# Patient Record
Sex: Male | Born: 1965 | Race: White | Hispanic: No | Marital: Married | Smoking: Never smoker
Health system: Southern US, Community
[De-identification: ages and names within clinical notes are randomized; demographics above are authoritative.]

## PROBLEM LIST (undated history)

## (undated) DIAGNOSIS — E785 Hyperlipidemia, unspecified: Secondary | ICD-10-CM

## (undated) DIAGNOSIS — N200 Calculus of kidney: Secondary | ICD-10-CM

## (undated) HISTORY — PX: COLONOSCOPY: SHX174

## (undated) HISTORY — PX: OTHER SURGICAL HISTORY: SHX169

---

## 2018-12-22 ENCOUNTER — Encounter (HOSPITAL_COMMUNITY): Payer: Self-pay | Admitting: Emergency Medicine

## 2018-12-22 ENCOUNTER — Observation Stay (HOSPITAL_COMMUNITY)
Admission: EM | Admit: 2018-12-22 | Discharge: 2018-12-24 | Disposition: A | Discharge status: Home / Self Care | Payer: BLUE CROSS/BLUE SHIELD | Attending: Internal Medicine | Admitting: Internal Medicine

## 2018-12-22 ENCOUNTER — Other Ambulatory Visit: Payer: Self-pay

## 2018-12-22 DIAGNOSIS — E785 Hyperlipidemia, unspecified: Secondary | ICD-10-CM | POA: Diagnosis not present

## 2018-12-22 DIAGNOSIS — Z86010 Personal history of colonic polyps: Secondary | ICD-10-CM | POA: Diagnosis not present

## 2018-12-22 DIAGNOSIS — K633 Ulcer of intestine: Secondary | ICD-10-CM | POA: Insufficient documentation

## 2018-12-22 DIAGNOSIS — D62 Acute posthemorrhagic anemia: Secondary | ICD-10-CM | POA: Diagnosis not present

## 2018-12-22 DIAGNOSIS — Z79899 Other long term (current) drug therapy: Secondary | ICD-10-CM | POA: Diagnosis not present

## 2018-12-22 DIAGNOSIS — K641 Second degree hemorrhoids: Secondary | ICD-10-CM | POA: Insufficient documentation

## 2018-12-22 DIAGNOSIS — Z1159 Encounter for screening for other viral diseases: Secondary | ICD-10-CM | POA: Diagnosis not present

## 2018-12-22 DIAGNOSIS — K5730 Diverticulosis of large intestine without perforation or abscess without bleeding: Secondary | ICD-10-CM | POA: Diagnosis not present

## 2018-12-22 DIAGNOSIS — K91840 Postprocedural hemorrhage and hematoma of a digestive system organ or structure following a digestive system procedure: Secondary | ICD-10-CM | POA: Diagnosis not present

## 2018-12-22 DIAGNOSIS — Y838 Other surgical procedures as the cause of abnormal reaction of the patient, or of later complication, without mention of misadventure at the time of the procedure: Secondary | ICD-10-CM | POA: Diagnosis not present

## 2018-12-22 DIAGNOSIS — K922 Gastrointestinal hemorrhage, unspecified: Secondary | ICD-10-CM

## 2018-12-22 DIAGNOSIS — K625 Hemorrhage of anus and rectum: Secondary | ICD-10-CM | POA: Diagnosis present

## 2018-12-22 DIAGNOSIS — K644 Residual hemorrhoidal skin tags: Secondary | ICD-10-CM | POA: Diagnosis not present

## 2018-12-22 DIAGNOSIS — R109 Unspecified abdominal pain: Secondary | ICD-10-CM

## 2018-12-22 DIAGNOSIS — K631 Perforation of intestine (nontraumatic): Secondary | ICD-10-CM

## 2018-12-22 HISTORY — DX: Calculus of kidney: N20.0

## 2018-12-22 HISTORY — DX: Hyperlipidemia, unspecified: E78.5

## 2018-12-22 LAB — CBC
HCT: 45.4 % (ref 39.0–52.0)
Hemoglobin: 15.5 g/dL (ref 13.0–17.0)
MCH: 31.4 pg (ref 26.0–34.0)
MCHC: 34.1 g/dL (ref 30.0–36.0)
MCV: 91.9 fL (ref 80.0–100.0)
Platelets: 289 10*3/uL (ref 150–400)
RBC: 4.94 MIL/uL (ref 4.22–5.81)
RDW: 11.9 % (ref 11.5–15.5)
WBC: 10.2 10*3/uL (ref 4.0–10.5)
nRBC: 0 % (ref 0.0–0.2)

## 2018-12-22 NOTE — ED Triage Notes (Signed)
Per pt he had a colonoscopy yesterday where they removed two polyps, today he is having a rectal bleed all day long with BM.

## 2018-12-23 ENCOUNTER — Other Ambulatory Visit: Payer: Self-pay

## 2018-12-23 ENCOUNTER — Observation Stay (HOSPITAL_COMMUNITY): Payer: BLUE CROSS/BLUE SHIELD

## 2018-12-23 ENCOUNTER — Observation Stay (HOSPITAL_COMMUNITY): Payer: BLUE CROSS/BLUE SHIELD | Admitting: Certified Registered"

## 2018-12-23 ENCOUNTER — Encounter (HOSPITAL_COMMUNITY): Payer: Self-pay | Admitting: Internal Medicine

## 2018-12-23 ENCOUNTER — Encounter (HOSPITAL_COMMUNITY)
Admission: EM | Disposition: A | Discharge status: Home / Self Care | Payer: Self-pay | Source: Home / Self Care | Attending: Emergency Medicine

## 2018-12-23 DIAGNOSIS — K633 Ulcer of intestine: Secondary | ICD-10-CM | POA: Diagnosis not present

## 2018-12-23 DIAGNOSIS — E785 Hyperlipidemia, unspecified: Secondary | ICD-10-CM | POA: Diagnosis not present

## 2018-12-23 DIAGNOSIS — K625 Hemorrhage of anus and rectum: Secondary | ICD-10-CM | POA: Diagnosis not present

## 2018-12-23 DIAGNOSIS — K5790 Diverticulosis of intestine, part unspecified, without perforation or abscess without bleeding: Secondary | ICD-10-CM | POA: Diagnosis not present

## 2018-12-23 DIAGNOSIS — K91840 Postprocedural hemorrhage and hematoma of a digestive system organ or structure following a digestive system procedure: Secondary | ICD-10-CM

## 2018-12-23 DIAGNOSIS — K921 Melena: Secondary | ICD-10-CM

## 2018-12-23 DIAGNOSIS — K922 Gastrointestinal hemorrhage, unspecified: Secondary | ICD-10-CM | POA: Diagnosis present

## 2018-12-23 DIAGNOSIS — K641 Second degree hemorrhoids: Secondary | ICD-10-CM

## 2018-12-23 DIAGNOSIS — K5730 Diverticulosis of large intestine without perforation or abscess without bleeding: Secondary | ICD-10-CM

## 2018-12-23 HISTORY — PX: COLONOSCOPY WITH PROPOFOL: SHX5780

## 2018-12-23 HISTORY — PX: SCLEROTHERAPY: SHX6841

## 2018-12-23 HISTORY — PX: HEMOSTASIS CLIP PLACEMENT: SHX6857

## 2018-12-23 LAB — CBC
HCT: 44 % (ref 39.0–52.0)
HCT: 39.6 % (ref 39.0–52.0)
HCT: 35.4 % — ABNORMAL LOW (ref 39.0–52.0)
HCT: 30.7 % — ABNORMAL LOW (ref 39.0–52.0)
Hemoglobin: 15.2 g/dL (ref 13.0–17.0)
Hemoglobin: 13.2 g/dL (ref 13.0–17.0)
Hemoglobin: 12 g/dL — ABNORMAL LOW (ref 13.0–17.0)
Hemoglobin: 10.5 g/dL — ABNORMAL LOW (ref 13.0–17.0)
MCH: 31.7 pg (ref 26.0–34.0)
MCH: 30.7 pg (ref 26.0–34.0)
MCH: 31.7 pg (ref 26.0–34.0)
MCH: 31.3 pg (ref 26.0–34.0)
MCHC: 34.5 g/dL (ref 30.0–36.0)
MCHC: 33.3 g/dL (ref 30.0–36.0)
MCHC: 33.9 g/dL (ref 30.0–36.0)
MCHC: 34.2 g/dL (ref 30.0–36.0)
MCV: 91.9 fL (ref 80.0–100.0)
MCV: 92.1 fL (ref 80.0–100.0)
MCV: 93.4 fL (ref 80.0–100.0)
MCV: 91.6 fL (ref 80.0–100.0)
Platelets: 283 10*3/uL (ref 150–400)
Platelets: 259 10*3/uL (ref 150–400)
Platelets: 245 10*3/uL (ref 150–400)
Platelets: 229 10*3/uL (ref 150–400)
RBC: 4.79 MIL/uL (ref 4.22–5.81)
RBC: 4.3 MIL/uL (ref 4.22–5.81)
RBC: 3.79 MIL/uL — ABNORMAL LOW (ref 4.22–5.81)
RBC: 3.35 MIL/uL — ABNORMAL LOW (ref 4.22–5.81)
RDW: 11.9 % (ref 11.5–15.5)
RDW: 12 % (ref 11.5–15.5)
RDW: 12 % (ref 11.5–15.5)
RDW: 12 % (ref 11.5–15.5)
WBC: 10.9 10*3/uL — ABNORMAL HIGH (ref 4.0–10.5)
WBC: 9.3 10*3/uL (ref 4.0–10.5)
WBC: 18.1 10*3/uL — ABNORMAL HIGH (ref 4.0–10.5)
WBC: 19.8 10*3/uL — ABNORMAL HIGH (ref 4.0–10.5)
nRBC: 0 % (ref 0.0–0.2)
nRBC: 0 % (ref 0.0–0.2)
nRBC: 0 % (ref 0.0–0.2)
nRBC: 0 % (ref 0.0–0.2)

## 2018-12-23 LAB — PROTIME-INR
INR: 1.1 (ref 0.8–1.2)
Prothrombin Time: 13.7 seconds (ref 11.4–15.2)

## 2018-12-23 LAB — COMPREHENSIVE METABOLIC PANEL
ALT: 40 U/L (ref 0–44)
AST: 26 U/L (ref 15–41)
Albumin: 3.8 g/dL (ref 3.5–5.0)
Alkaline Phosphatase: 79 U/L (ref 38–126)
Anion gap: 10 (ref 5–15)
BUN: 19 mg/dL (ref 6–20)
CO2: 21 mmol/L — ABNORMAL LOW (ref 22–32)
Calcium: 9.4 mg/dL (ref 8.9–10.3)
Chloride: 104 mmol/L (ref 98–111)
Creatinine, Ser: 1.14 mg/dL (ref 0.61–1.24)
GFR calc Af Amer: >60 mL/min (ref >60)
GFR calc non Af Amer: >60 mL/min (ref >60)
Glucose, Bld: 124 mg/dL — ABNORMAL HIGH (ref 70–99)
Potassium: 4.1 mmol/L (ref 3.5–5.1)
Sodium: 135 mmol/L (ref 135–145)
Total Bilirubin: 0.6 mg/dL (ref 0.3–1.2)
Total Protein: 6.7 g/dL (ref 6.5–8.1)

## 2018-12-23 LAB — TYPE AND SCREEN
ABO/RH(D): O POS
Antibody Screen: NEGATIVE

## 2018-12-23 LAB — ABO/RH: ABO/RH(D): O POS

## 2018-12-23 LAB — BASIC METABOLIC PANEL
Anion gap: 7 (ref 5–15)
BUN: 16 mg/dL (ref 6–20)
CO2: 26 mmol/L (ref 22–32)
Calcium: 8.3 mg/dL — ABNORMAL LOW (ref 8.9–10.3)
Chloride: 102 mmol/L (ref 98–111)
Creatinine, Ser: 1.12 mg/dL (ref 0.61–1.24)
GFR calc Af Amer: >60 mL/min (ref >60)
GFR calc non Af Amer: >60 mL/min (ref >60)
Glucose, Bld: 119 mg/dL — ABNORMAL HIGH (ref 70–99)
Potassium: 3.5 mmol/L (ref 3.5–5.1)
Sodium: 135 mmol/L (ref 135–145)

## 2018-12-23 LAB — GLUCOSE, CAPILLARY: Glucose-Capillary: 93 mg/dL (ref 70–99)

## 2018-12-23 LAB — APTT: aPTT: 30 seconds (ref 24–36)

## 2018-12-23 LAB — HIV ANTIBODY (ROUTINE TESTING W REFLEX): HIV Screen 4th Generation wRfx: NONREACTIVE

## 2018-12-23 LAB — POC OCCULT BLOOD, ED: Fecal Occult Bld: POSITIVE — AB

## 2018-12-23 LAB — SARS CORONAVIRUS 2 BY RT PCR (HOSPITAL ORDER, PERFORMED IN ~~LOC~~ HOSPITAL LAB): SARS Coronavirus 2: NEGATIVE

## 2018-12-23 SURGERY — COLONOSCOPY WITH PROPOFOL
Anesthesia: Monitor Anesthesia Care

## 2018-12-23 MED ORDER — FENTANYL CITRATE (PF) 250 MCG/5ML IJ SOLN
50.0000 ug | Freq: Once | INTRAMUSCULAR | Status: AC
  Administered 2018-12-23: 50 ug via INTRAVENOUS

## 2018-12-23 MED ORDER — SODIUM CHLORIDE (PF) 0.9 % IJ SOLN: PREFILLED_SYRINGE | INTRAMUSCULAR | Status: DC | PRN

## 2018-12-23 MED ORDER — CIPROFLOXACIN IN D5W 400 MG/200ML IV SOLN
400.0000 mg | Freq: Two times a day (BID) | INTRAVENOUS | Status: DC
  Administered 2018-12-23 – 2018-12-24 (×3): 400 mg via INTRAVENOUS
  Filled 2018-12-23 (×2): qty 200

## 2018-12-23 MED ORDER — SODIUM CHLORIDE 0.9 % IV SOLN
INTRAVENOUS | Status: DC
  Administered 2018-12-23 – 2018-12-24 (×2): via INTRAVENOUS

## 2018-12-23 MED ORDER — ONDANSETRON HCL 4 MG PO TABS: 4.0000 mg | ORAL_TABLET | Freq: Four times a day (QID) | ORAL | Status: DC | PRN

## 2018-12-23 MED ORDER — PROPOFOL 500 MG/50ML IV EMUL
INTRAVENOUS | Status: DC | PRN
  Administered 2018-12-23: 100 ug/kg/min via INTRAVENOUS

## 2018-12-23 MED ORDER — ACETAMINOPHEN 325 MG PO TABS: 650.0000 mg | ORAL_TABLET | Freq: Four times a day (QID) | ORAL | Status: DC | PRN

## 2018-12-23 MED ORDER — FENTANYL CITRATE (PF) 250 MCG/5ML IJ SOLN
50.0000 ug | Freq: Once | INTRAMUSCULAR | Status: AC
  Administered 2018-12-23: 25 ug via INTRAVENOUS

## 2018-12-23 MED ORDER — ONDANSETRON HCL 4 MG/2ML IJ SOLN
INTRAMUSCULAR | Status: AC
  Filled 2018-12-23: qty 2

## 2018-12-23 MED ORDER — SODIUM CHLORIDE (PF) 0.9 % IJ SOLN
PREFILLED_SYRINGE | INTRAMUSCULAR | Status: DC | PRN
  Administered 2018-12-23 (×3): 2 mL

## 2018-12-23 MED ORDER — CIPROFLOXACIN IN D5W 400 MG/200ML IV SOLN
INTRAVENOUS | Status: AC
  Filled 2018-12-23: qty 200

## 2018-12-23 MED ORDER — LACTATED RINGERS IV SOLN
INTRAVENOUS | Status: DC
  Administered 2018-12-23: 1000 mL via INTRAVENOUS

## 2018-12-23 MED ORDER — SODIUM CHLORIDE 0.9 % IV BOLUS
1000.0000 mL | Freq: Once | INTRAVENOUS | Status: AC
  Administered 2018-12-23: 1000 mL via INTRAVENOUS

## 2018-12-23 MED ORDER — ONDANSETRON HCL 4 MG/2ML IJ SOLN: 4.0000 mg | Freq: Four times a day (QID) | INTRAMUSCULAR | Status: DC | PRN

## 2018-12-23 MED ORDER — ONDANSETRON HCL 4 MG/2ML IJ SOLN
4.0000 mg | Freq: Once | INTRAMUSCULAR | Status: AC
  Administered 2018-12-23: 4 mg via INTRAVENOUS

## 2018-12-23 MED ORDER — PEG 3350-KCL-NA BICARB-NACL 420 G PO SOLR
4000.0000 mL | Freq: Once | ORAL | Status: AC
  Administered 2018-12-23: 4000 mL via ORAL
  Filled 2018-12-23: qty 4000

## 2018-12-23 MED ORDER — PROPOFOL 10 MG/ML IV BOLUS
INTRAVENOUS | Status: DC | PRN
  Administered 2018-12-23 (×3): 20 mg via INTRAVENOUS

## 2018-12-23 MED ORDER — SODIUM CHLORIDE 0.9 % IV SOLN: INTRAVENOUS | Status: DC

## 2018-12-23 MED ORDER — PRAVASTATIN SODIUM 10 MG PO TABS
20.0000 mg | ORAL_TABLET | ORAL | Status: DC
  Administered 2018-12-24: 20 mg via ORAL
  Filled 2018-12-23: qty 2

## 2018-12-23 MED ORDER — FENTANYL CITRATE (PF) 100 MCG/2ML IJ SOLN
INTRAMUSCULAR | Status: AC
  Filled 2018-12-23: qty 2

## 2018-12-23 MED ORDER — METOCLOPRAMIDE HCL 5 MG/ML IJ SOLN
5.0000 mg | Freq: Once | INTRAMUSCULAR | Status: AC
  Administered 2018-12-23: 12:00:00 5 mg via INTRAVENOUS
  Filled 2018-12-23: qty 1

## 2018-12-23 MED ORDER — ACETAMINOPHEN 650 MG RE SUPP: 650.0000 mg | Freq: Four times a day (QID) | RECTAL | Status: DC | PRN

## 2018-12-23 MED ORDER — METOCLOPRAMIDE HCL 5 MG/ML IJ SOLN
5.0000 mg | Freq: Once | INTRAMUSCULAR | Status: AC
  Administered 2018-12-23: 5 mg via INTRAVENOUS
  Filled 2018-12-23: qty 2

## 2018-12-23 MED ORDER — EPINEPHRINE 1 MG/10ML IJ SOSY
PREFILLED_SYRINGE | INTRAMUSCULAR | Status: AC
  Filled 2018-12-23: qty 10

## 2018-12-23 SURGICAL SUPPLY — 22 items

## 2018-12-23 NOTE — Interval H&P Note (Signed)
History and Physical Interval Note:  12/23/2018 9:57 AM  Jeff Robles  has presented today for surgery, with the diagnosis of hematochezia s/p colonoscopy 5/15.  The various methods of treatment have been discussed with the patient and family. After consideration of risks, benefits and other options for treatment, the patient has consented to  Procedure(s): COLONOSCOPY WITH PROPOFOL (N/A) as a surgical intervention.  The patient's history has been reviewed, patient examined, no change in status, stable for surgery.  I have reviewed the patient's chart and labs.  Questions were answered to the patient's satisfaction.     [employer]

## 2018-12-23 NOTE — ED Provider Notes (Signed)
TIME SEEN: 12:08 AM  CHIEF COMPLAINT: Hematochezia  HPI: Patient is a 53 year old male with history of kidney stones who presents to the emergency department with rectal bleeding.  He reports he had a routine colonoscopy with Dr. Loreta Ave on Friday, May 15.  States he was doing fine and tonight after eating dinner went to have a bowel movement and noticed it was a large volume of liquid bright red blood.  No clots and no stool present.  States he then had a second episode of the same about an hour later and decided to come to the ER.  States he has had some abdominal bloating but no pain.  No vomiting.  No melena.  Not on antiplatelets or anticoagulants.  States he did have 2 polyps removed but is not sure of any other findings from his colonoscopy.  No known fevers, chills, cough, chest pain, shortness of breath.  ROS: See HPI Constitutional: no fever  Eyes: no drainage  ENT: no runny nose   Cardiovascular:  no chest pain  Resp: no SOB  GI: no vomiting GU: no dysuria Integumentary: no rash  Allergy: no hives  Musculoskeletal: no leg swelling  Neurological: no slurred speech ROS otherwise negative  PAST MEDICAL HISTORY/PAST SURGICAL HISTORY:  Past Medical History:  Diagnosis Date  . Kidney stones     MEDICATIONS:  Prior to Admission medications   Not on File    ALLERGIES:  No Known Allergies  SOCIAL HISTORY:  Social History   Tobacco Use  . Smoking status: Never Smoker  . Smokeless tobacco: Never Used  Substance Use Topics  . Alcohol use: Yes    Comment: ocassionally    FAMILY HISTORY: No family history on file.  EXAM: BP 139/89   Pulse 93   Temp 99.5 F (37.5 C) (Oral)   Resp 18   Ht 5\' 8"  (1.727 m)   Wt 90.7 kg   SpO2 94%   BMI 30.41 kg/m  CONSTITUTIONAL: Alert and oriented and responds appropriately to questions. Well-appearing; well-nourished HEAD: Normocephalic EYES: Conjunctivae clear, pupils appear equal, EOMI ENT: normal nose; moist mucous  membranes NECK: Supple, no meningismus, no nuchal rigidity, no LAD  CARD: RRR; S1 and S2 appreciated; no murmurs, no clicks, no rubs, no gallops RESP: Normal chest excursion without splinting or tachypnea; breath sounds clear and equal bilaterally; no wheezes, no rhonchi, no rales, no hypoxia or respiratory distress, speaking full sentences ABD/GI: Normal bowel sounds; non-distended; soft, non-tender, no rebound, no guarding, no peritoneal signs, no hepatosplenomegaly RECTAL:  Normal rectal tone, amount of gross red blood on digital rectal exam without melena or clots, guaiac positive, no hemorrhoids appreciated, nontender rectal exam, no fecal impaction BACK:  The back appears normal and is non-tender to palpation, there is no CVA tenderness EXT: Normal ROM in all joints; non-tender to palpation; no edema; normal capillary refill; no cyanosis, no calf tenderness or swelling    SKIN: Normal color for age and race; warm; no rash NEURO: Moves all extremities equally PSYCH: The patient's mood and manner are appropriate. Grooming and personal hygiene are appropriate.  MEDICAL DECISION MAKING: Patient here with rectal bleeding.  He is hemodynamically stable currently with a hemoglobin of 15.5.  Not on blood thinners.  Abdominal exam is benign.  I am not able to see patient's colonoscopy report in our system.  Will discuss with on-call GI physician.  ED PROGRESS:    12:40 AM  Discussed with Dr. Meridee Score with [facility] on call for Dr.  Loreta AveMann.  Appreciate his help.   He recommends close observation of patient whether that be in the hospital or the emergency department.  Patient would prefer admission which I feel is reasonable given large volume hematochezia at home.  GI would like patient started on GoLYTELY prep for potential colonoscopy in the morning.  We will keep him n.p.o. otherwise.  Patient comfortable with this plan.  Will discuss with medicine for admission.  [facility]   1:14 AM Discussed patient's case with hospitalist, Dr. Clyde LundborgNiu.  I have recommended admission and patient (and family if present) agree with this plan. Admitting physician will place admission orders.   I reviewed all nursing notes, vitals, pertinent previous records, EKGs, lab and urine results, imaging (as available).     EKG Interpretation  Date/Time:  Sunday Dec 23 2018 01:54:37 EDT Ventricular Rate:  74 PR Interval:    QRS Duration: 79 QT Interval:  363 QTC Calculation: 403 R Axis:   28 Text Interpretation:  Sinus rhythm No old tracing to compare Confirmed by Ardella Chhim, Baxter HireKristen [phone]([id number]) on 12/23/2018 1:57:21 AM          Allycia Pitz, Layla MawKristen N, DO 12/23/18 [id number]

## 2018-12-23 NOTE — Anesthesia Postprocedure Evaluation (Signed)
Anesthesia Post Note  Patient: Jeff Robles  Procedure(s) Performed: COLONOSCOPY WITH PROPOFOL (N/A ) SCLEROTHERAPY HEMOSTASIS CLIP PLACEMENT     Patient location during evaluation: PACU Anesthesia Type: MAC Level of consciousness: awake and alert Pain management: pain level controlled Vital Signs Assessment: post-procedure vital signs reviewed and stable Respiratory status: spontaneous breathing, nonlabored ventilation, respiratory function stable and patient connected to nasal cannula oxygen Cardiovascular status: stable and blood pressure returned to baseline Postop Assessment: no apparent nausea or vomiting Anesthetic complications: no    Last Vitals:  Vitals:   12/23/18 1426 12/23/18 1635  BP: 117/73 133/70  Pulse: 89 90  Resp: 18 18  Temp: 36.7 C 37.4 C  SpO2: 93% 95%    Last Pain:  Vitals:   12/23/18 1635  TempSrc: Oral  PainSc:                  Tiajuana Amass

## 2018-12-23 NOTE — Progress Notes (Signed)
Patient's wife called for update. Jan - [phone].

## 2018-12-23 NOTE — Progress Notes (Signed)
Patient drinking Go-lytely. Noted bright red bloody stool in toilet. Will have patient to use [facility] for safety and to monitor output.

## 2018-12-23 NOTE — Progress Notes (Signed)
PROGRESS NOTE    Jeff Robles  QQI:[id number] DOB: May 07, 1966 DOA: 12/22/2018 PCP: Patient, No Pcp Per    Brief Narrative:  Jeff Robles is a 53 y.o. male with medical history significant of HLD, kidney stone, who presents with rectal bleeding.  Patient states that he had routine colonoscopy, with two polyps removed by Dr. Loreta Ave 5/15.  He has been doing fine until tonight when he started having rectal bleeding with bright red blood.  He has had 2 episodes of bloody bowel movement.  No nausea, vomiting, abdominal pain.  He has some abdominal bloating.  He had mild lightheadedness earlier, which has resolved.  Denies chest pain, shortness of breat.  No cough, fever or chills.  No symptoms of UTI or unilateral weakness.  ED Course: pt was found to have hemoglobin 15.5, positive FOBT, WBC 10.2, electrolytes renal function okay, temperature 99.5, no tachycardia, no tachypnea, oxygen saturation 93-96% on room air, blood pressure 134/83.  Patient is placed on telemetry bed for observation. [facility], Dr. Meridee Score was consulted.    Assessment & Plan:   Principal Problem:   GIB (gastrointestinal bleeding) Active Problems:   HLD (hyperlipidemia)   Acute blood loss anemia Lower GI bleed Cecum ulcer x 2 Patient presenting from home with rectal bleeding.  Recent colonoscopy outpatient with removal of 2 polyps by Dr. Loreta Ave on 12/21/2018.  Hemoglobin on admission 15.5 with a positive FOBT.  Underwent repeat colonoscopy on 12/23/2018 with findings of a 20 mm ulcer localized to the cecum with spurting bleeding and visible vessel, underwent injection of 7 mL's of epinephrine and 9 clips placed, unable to perform BiCAP due to patient's snoring.  There was also a 4 mm cecum ulcer from previous polypectomy which one clip was placed.  Patient did have some abdominal discomfort following the procedure in which a KUB was performed that showed no free air on the supine study; radiology recommended a right  side up decubitus abdominal x-ray for further evaluation --Hgb 15.5-->15.2 stable --Right decubitus abdominal x-ray pending for evaluation of perforation --Continue to monitor hemoglobin/hematocrit every 6 hours --Supportive care, pain control --Serial abdominal exams --Continue NS at 100 mils per hour while n.p.o. pending abdominal x-ray evaluation --If abdominal pain does not subside or improve, may need CT abdomen/pelvis for further evaluation of symptoms   DVT prophylaxis: SCD's Code Status: Full code Family Communication: None Disposition Plan: Continue observation, pending decubitus x-ray for evaluation of possible perforation given abdominal discomfort following colonoscopy with multiple copes place, likely will need to remain hospitalized overnight for further observation and if stable will likely discharge home tomorrow   Consultants:   [facility] - Dr. Inda Merlin  Procedures:   Colonoscopy 5/17 - 62mm cecum ulcer s/p epi and 9 clips, 44mm cecum ulcer s/p 1 clip  Antimicrobials: cipro 400mg  IV q12h 5/17>>>   Subjective: Patient seen and examined at bedside, resting comfortably.  About to proceed to his colonoscopy.  Reports continued right red blood even with GoLYTELY preparation.  No other complaints or concerns at this time.  Denies chest pain, no shortness of breath, no palpitations, no nausea/vomiting, no fever/chills/night sweats, no cough/congestion, no weakness.  No acute events overnight per nursing staff.  Objective: Vitals:   12/23/18 0130 12/23/18 0208 12/23/18 0505 12/23/18 0932  BP: 140/85 (!) 155/83 134/80 (!) 154/95  Pulse: 73 77 88 90  Resp:  18 18 16   Temp:  98.9 F (37.2 C) 98.7 F (37.1 C) 98.6 F (37 C)  TempSrc:  Oral Oral Oral  SpO2: 96% 98% 100% 100%  Weight:    90 kg  Height:     (1.727 m)    Intake/Output Summary (Last 24 hours) at 12/23/2018 1203 Last data filed at 12/23/2018 0729 Gross per 24 hour  Intake 350 ml    Output 300 ml  Net 50 ml   Filed Weights   12/22/18 2317 12/23/18 0932  Weight: 90.7 kg 90 kg    Examination:  General exam: Appears calm and comfortable  Respiratory system: Clear to auscultation. Respiratory effort normal. Cardiovascular system: S1 & S2 heard, RRR. No JVD, murmurs, rubs, gallops or clicks. No pedal edema. Gastrointestinal system: Abdomen is nondistended, soft and nontender. No organomegaly or masses felt. Normal bowel sounds heard. Central nervous system: Alert and oriented. No focal neurological deficits. Extremities: Symmetric 5 x 5 power. Skin: No rashes, lesions or ulcers Psychiatry: Judgement and insight appear normal. Mood & affect appropriate.     Data Reviewed: I have personally reviewed following labs and imaging studies  CBC: Recent Labs  Lab 12/22/18 2324 12/23/18 0141 12/23/18 0744  WBC 10.2 10.9* 9.3  HGB 15.5 15.2 13.2  HCT 45.4 44.0 39.6  MCV 91.9 91.9 92.1  PLT 289 283 259   Basic Metabolic Panel: Recent Labs  Lab 12/22/18 2324 12/23/18 0744  NA 135 135  K 4.1 3.5  CL 104 102  CO2 21* 26  GLUCOSE 124* 119*  BUN 19 16  CREATININE 1.14 1.12  CALCIUM 9.4 8.3*   GFR: Estimated Creatinine Clearance: 81.2 mL/min (by C-G formula based on SCr of 1.12 mg/dL). Liver Function Tests: Recent Labs  Lab 12/22/18 2324  AST 26  ALT 40  ALKPHOS 79  BILITOT 0.6  PROT 6.7  ALBUMIN 3.8   No results for input(s): LIPASE, AMYLASE in the last 168 hours. No results for input(s): AMMONIA in the last 168 hours. Coagulation Profile: Recent Labs  Lab 12/23/18 0141  INR 1.1   Cardiac Enzymes: No results for input(s): CKTOTAL, CKMB, CKMBINDEX, TROPONINI in the last 168 hours. BNP (last 3 results) No results for input(s): PROBNP in the last 8760 hours. HbA1C: No results for input(s): HGBA1C in the last 72 hours. CBG: Recent Labs  Lab 12/23/18 0620  GLUCAP 93   Lipid Profile: No results for input(s): CHOL, HDL, LDLCALC, TRIG,  CHOLHDL, LDLDIRECT in the last 72 hours. Thyroid Function Tests: No results for input(s): TSH, T4TOTAL, FREET4, T3FREE, THYROIDAB in the last 72 hours. Anemia Panel: No results for input(s): VITAMINB12, FOLATE, FERRITIN, TIBC, IRON, RETICCTPCT in the last 72 hours. Sepsis Labs: No results for input(s): PROCALCITON, LATICACIDVEN in the last 168 hours.  Recent Results (from the past 240 hour(s))  SARS Coronavirus 2 (CEPHEID - Performed in [facility] hospital lab), Hosp Order     Status: None   Collection Time: 12/23/18  1:16 AM  Result Value Ref Range Status   SARS Coronavirus 2 NEGATIVE NEGATIVE Final    Comment: (NOTE) If result is NEGATIVE SARS-CoV-2 target nucleic acids are NOT DETECTED. The SARS-CoV-2 RNA is generally detectable in upper and lower  respiratory specimens during the acute phase of infection. The lowest  concentration of SARS-CoV-2 viral copies this assay can detect is 250  copies / mL. A negative result does not preclude SARS-CoV-2 infection  and should not be used as the sole basis for treatment or other  patient management decisions.  A negative result may occur with  improper specimen collection / handling, submission of  specimen other  than nasopharyngeal swab, presence of viral mutation(s) within the  areas targeted by this assay, and inadequate number of viral copies  (<250 copies / mL). A negative result must be combined with clinical  observations, patient history, and epidemiological information. If result is POSITIVE SARS-CoV-2 target nucleic acids are DETECTED. The SARS-CoV-2 RNA is generally detectable in upper and lower  respiratory specimens dur ing the acute phase of infection.  Positive  results are indicative of active infection with SARS-CoV-2.  Clinical  correlation with patient history and other diagnostic information is  necessary to determine patient infection status.  Positive results do  not rule out bacterial infection or co-infection  with other viruses. If result is PRESUMPTIVE POSTIVE SARS-CoV-2 nucleic acids MAY BE PRESENT.   A presumptive positive result was obtained on the submitted specimen  and confirmed on repeat testing.  While 2019 novel coronavirus  (SARS-CoV-2) nucleic acids may be present in the submitted sample  additional confirmatory testing may be necessary for epidemiological  and / or clinical management purposes  to differentiate between  SARS-CoV-2 and other Sarbecovirus currently known to infect humans.  If clinically indicated additional testing with an alternate test  methodology [phone]([id number]) is advised. The SARS-CoV-2 RNA is generally  detectable in upper and lower respiratory sp ecimens during the acute  phase of infection. The expected result is Negative. Fact Sheet for Patients:  BoilerBrush.com.cyhttps://www.fda.gov/media/136312/download Fact Sheet for Healthcare Providers: https://pope.com/https://www.fda.gov/media/136313/download This test is not yet approved or cleared by the [place] FDA and has been authorized for detection and/or diagnosis of SARS-CoV-2 by FDA under an Emergency Use Authorization (EUA).  This EUA will remain in effect (meaning this test can be used) for the duration of the COVID-19 declaration under Section 564(b)(1) of the Act, 21 U.S.C. section 360bbb-3(b)(1), unless the authorization is terminated or revoked sooner. Performed at [facility], [address] [place], [place] [address]          Radiology Studies: Dg Abd 1 View - Kub  Result Date: 12/23/2018 CLINICAL DATA:  Evaluate for free air after colonoscopy EXAM: ABDOMEN - 1 VIEW COMPARISON:  None. FINDINGS: Cholecystectomy clips are noted. Bowel gas pattern is unremarkable. No free air on this supine study. IMPRESSION: No free air on this supine study. If there is continued concern, an upright PA and lateral chest x-ray or a right side up decubitus abdominal x-ray could further evaluate. Electronically Signed   By: Gerome Samavid   Williams III M.D   On: 12/23/2018 11:59   Dg Chest Port 1 View  Result Date: 12/23/2018 CLINICAL DATA:  Evaluate for free air after colonoscopy. EXAM: PORTABLE CHEST 1 VIEW COMPARISON:  None. FINDINGS: This study is only semi upright. However, within this limitation, there is no free air under the diaphragm identified. The heart, hila, mediastinum are normal. No pneumothorax. No pulmonary nodules or masses. Minimal atelectasis in the left base. The cardiomediastinal silhouette is normal. IMPRESSION: 1. No free air is identified on this semi upright view. If there is continued concern, a fully upright view or decubitus view could be obtained. No other abnormalities. Electronically Signed   By: Gerome Samavid  Williams III M.D   On: 12/23/2018 11:58        Scheduled Meds:  fentaNYL  50 mcg Intravenous Once   metoCLOPramide  5 mg Intravenous Once   ondansetron (ZOFRAN) IV  4 mg Intravenous Once   [MAR Hold] pravastatin  20 mg Oral Q M,W,F   Continuous Infusions:  sodium  chloride 100 mL/hr at 12/23/18 0825   sodium chloride     ciprofloxacin     lactated ringers 1,000 mL (12/23/18 0937)     LOS: 0 days    Time spent: 29 minutes    Alvira Philips [place], DO [facility] Pager [phone]  If 7PM-7AM, please contact night-coverage [email or web address] Password [facility] 12/23/2018, 12:03 PM

## 2018-12-23 NOTE — H&P (Signed)
History and Physical    Jeff Robles:[id number]:[id number] DOB: 03/06/1966 DOA: 12/22/2018  Referring MD/NP/PA:   PCP: Patient, No Pcp Per   Patient coming from:  The patient is coming from home.  At baseline, pt is independent for most of ADL.        Chief Complaint: rectal bleeding  HPI: Jeff Robles is a 53 y.o. male with medical history significant of HLD, kidney stone, who presents with rectal bleeding.  Patient states that he had routine colonoscopy, with two polyps removed by Dr. Loreta AveMann 5/15.  He has been doing fine until tonight when he started having rectal bleeding with bright red blood.  He has had 2 episodes of bloody bowel movement.  No nausea, vomiting, abdominal pain.  He has some abdominal bloating.  He had mild lightheadedness earlier, which has resolved.  Denies chest pain, shortness of breat.  No cough, fever or chills.  No symptoms of UTI or unilateral weakness.  ED Course: pt was found to have hemoglobin 15.5, positive FOBT, WBC 10.2, electrolytes renal function okay, temperature 99.5, no tachycardia, no tachypnea, oxygen saturation 93-96% on room air, blood pressure 134/83.  Patient is placed on telemetry bed for observation.  [facility], Dr. Meridee ScoreMansouraty was consulted.  Review of Systems:   General: no fevers, chills, no body weight gain, fatigue HEENT: no blurry vision, hearing changes or sore throat Respiratory: no dyspnea, coughing, wheezing CV: no chest pain, no palpitations GI: no nausea, vomiting, abdominal pain, diarrhea, constipation. Has rectal bleeding. GU: no dysuria, burning on urination, increased urinary frequency, hematuria  Ext: no leg edema Neuro: no unilateral weakness, numbness, or tingling, no vision change or hearing loss Skin: no rash, no skin tear. MSK: No muscle spasm, no deformity, no limitation of range of movement in spin Heme: No easy bruising.  Travel history: No recent long distant travel.  Allergy: No Known Allergies  Past Medical  History:  Diagnosis Date  . HLD (hyperlipidemia)   . Kidney stones     Past Surgical History:  Procedure Laterality Date  . COLONOSCOPY    . Kidney stone procedure      Social History:  reports that he has never smoked. He has never used smokeless tobacco. He reports current alcohol use. He reports that he does not use drugs.  Family History:  Family History  Problem Relation Age of Onset  . Hypercholesterolemia Mother   . COPD Father      Prior to Admission medications   Medication Sig Start Date End Date Taking? Authorizing Provider  pravastatin (PRAVACHOL) 20 MG tablet Take 20 mg by mouth every Monday, Wednesday, and Friday. 11/26/18  Yes [provider]    Physical Exam: Vitals:   12/23/18 0030 12/23/18 0045 12/23/18 0100 12/23/18 0115  BP: 133/84 134/83 (!) 143/90 (!) 149/91  Pulse: 86 72 73 80  Resp:      Temp:      TempSrc:      SpO2: 97% 96% 98% 97%  Weight:      Height:       General: Not in acute distress HEENT:       Eyes: PERRL, EOMI, no scleral icterus.       ENT: No discharge from the ears and nose, no pharynx injection, no tonsillar enlargement.        Neck: No JVD, no bruit, no mass felt. Heme: No neck lymph node enlargement. Cardiac: S1/S2, RRR, No murmurs, No gallops or rubs. Respiratory: No rales, wheezing,  rhonchi or rubs. GI: Soft, nondistended, nontender, no rebound pain, no organomegaly, BS present. GU: No hematuria Ext: No pitting leg edema bilaterally. 2+DP/PT pulse bilaterally. Musculoskeletal: No joint deformities, No joint redness or warmth, no limitation of ROM in spin. Skin: No rashes.  Neuro: Alert, oriented X3, cranial nerves II-XII grossly intact, moves all extremities normally. Psych: Patient is not psychotic, no suicidal or hemocidal ideation.  Labs on Admission: I have personally reviewed following labs and imaging studies  CBC: Recent Labs  Lab 12/22/18 2324  WBC 10.2  HGB 15.5  HCT 45.4  MCV 91.9  PLT 289    Basic Metabolic Panel: Recent Labs  Lab 12/22/18 2324  NA 135  K 4.1  CL 104  CO2 21*  GLUCOSE 124*  BUN 19  CREATININE 1.14  CALCIUM 9.4   GFR: Estimated Creatinine Clearance: 80 mL/min (by C-G formula based on SCr of 1.14 mg/dL). Liver Function Tests: Recent Labs  Lab 12/22/18 2324  AST 26  ALT 40  ALKPHOS 79  BILITOT 0.6  PROT 6.7  ALBUMIN 3.8   No results for input(s): LIPASE, AMYLASE in the last 168 hours. No results for input(s): AMMONIA in the last 168 hours. Coagulation Profile: No results for input(s): INR, PROTIME in the last 168 hours. Cardiac Enzymes: No results for input(s): CKTOTAL, CKMB, CKMBINDEX, TROPONINI in the last 168 hours. BNP (last 3 results) No results for input(s): PROBNP in the last 8760 hours. HbA1C: No results for input(s): HGBA1C in the last 72 hours. CBG: No results for input(s): GLUCAP in the last 168 hours. Lipid Profile: No results for input(s): CHOL, HDL, LDLCALC, TRIG, CHOLHDL, LDLDIRECT in the last 72 hours. Thyroid Function Tests: No results for input(s): TSH, T4TOTAL, FREET4, T3FREE, THYROIDAB in the last 72 hours. Anemia Panel: No results for input(s): VITAMINB12, FOLATE, FERRITIN, TIBC, IRON, RETICCTPCT in the last 72 hours. Urine analysis: No results found for: COLORURINE, APPEARANCEUR, LABSPEC, PHURINE, GLUCOSEU, HGBUR, BILIRUBINUR, KETONESUR, PROTEINUR, UROBILINOGEN, NITRITE, LEUKOCYTESUR Sepsis Labs: @LABRCNTIP (procalcitonin:4,lacticidven:4) )No results found for this or any previous visit (from the past 240 hour(s)).   Radiological Exams on Admission: No results found.   EKG: Reviewed independently.  Sinus rhythm, QTC 403, no ischemic change.  Assessment/Plan Principal Problem:   GIB (gastrointestinal bleeding) Active Problems:   HLD (hyperlipidemia)   GIB (gastrointestinal bleeding): Hgb 15.5.  Hemodynamically stable.  Patient had mild lightheadedness earlier, which has resolved.  Currently hemodynamically  stable.  Rectal bleeding is likely related to recent colonoscopy with polyp removal. [facility], Dr. Meridee Score was consulted-->recommended to do "Golytely prep".   - will place in tele bed for obs - GI consulted by Ed, will follow up recommendations - NPO - IVF: 1L NS bolus, then at 100 mL/hr - Zofran IV for nausea - Avoid NSAIDs and SQ heparin - Maintain IV access (2 large bore IVs if possible). - Monitor closely and follow q6h cbc, transfuse as necessary, if Hgb<7.0 - LaB: INR, PTT and type screen  HLD (hyperlipidemia): -Pravastatin    DVT ppx: SCD Code Status: Full code Family Communication: None at bed side.   Disposition Plan:  Anticipate discharge back to previous home environment Consults called:  GI, Dr. Meridee Score Admission status: Obs / tele     Date of Service 12/23/2018    Lorretta Harp [facility]   If 7PM-7AM, please contact night-coverage [email or web address] Password [email or web address] 12/23/2018, 1:52 AM

## 2018-12-23 NOTE — Consult Note (Signed)
Referring Provider: Dr. Eric [place]  Primary Care Physician:  Patient, No Pcp Per Primary Gastroenterologist:  Dr. Verdia Kuba  Reason for Consultation:  Rectal bleeding, S/P colonoscopy 12/21/2018  HPI: Jeff Robles is a 53 y.o. male with a past medical history of kidney stones, hyperlipidemia, diverticulitis and colon polyps. He underwent a routine colonoscopy with  Dr. Collene Mares on 12/21/2018, he reported having 2 polyps removed ( procedure report not available for review). He went home and ate a sandwich and went to bed without experiencing any abdominal pain. Saturday he passed a few green loose stools then a formed brown stool with a moderate amount of gas. Later that evening, he went to the bathroom and passed a large amount of red blood per the rectum without passing any stool. Approximately 45 minutes later, he went to the bathroom and passed a large amount of blood per the rectum. No associated abdominal or rectal pain. He denied having any chest pain, SOB or dizziness. He presented to the ED for further evaluation. Prior history of diverticulitis, 3 to 4 episodes in his lifetime. Last treated for diverticulitis with po antibiotics. Never required hospitalization for diverticulitis episodes.   ED Course: Na 135. K 4.1. BUN 19. Cr. 1.14. Alk Phos 79. AST 26. ALT 40. T. Bili 0.6. WBC 10.2. Hg 15.2. HCT 45.4. PLT 289. INR 1.1. COVID19 negative. FOBT +. EKG NSR, no ischemia.   He passed a moderate amount of red blood from the rectum last night prior to starting Nulytely bowel Prep. Since starting the bowel prep, he is passing large amounts of darker red water, blood on tissue when wiping is brighter red. He continues to drink the bowel prep at this time.   Past Medical History:  Diagnosis Date  . HLD (hyperlipidemia)   . Kidney stones     Past Surgical History:  Procedure Laterality Date  . COLONOSCOPY    . Kidney stone procedure      Prior to Admission medications   Medication Sig Start  Date End Date Taking? Authorizing Provider  pravastatin (PRAVACHOL) 20 MG tablet Take 20 mg by mouth every Monday, Wednesday, and Friday. 11/26/18  Yes [provider]    Current Facility-Administered Medications  Medication Dose Route Frequency Provider Last Rate Last Dose  . 0.9 %  sodium chloride infusion   Intravenous Continuous Ivor Costa, MD 100 mL/hr at 12/23/18 0230    . acetaminophen (TYLENOL) tablet 650 mg  650 mg Oral Q6H PRN Ivor Costa, MD       Or  . acetaminophen (TYLENOL) suppository 650 mg  650 mg Rectal Q6H PRN Ivor Costa, MD      . ondansetron [facility]) tablet 4 mg  4 mg Oral Q6H PRN Ivor Costa, MD       Or  . ondansetron [facility]) injection 4 mg  4 mg Intravenous Q6H PRN Ivor Costa, MD      . Derrill Memo ON 12/24/2018] pravastatin (PRAVACHOL) tablet 20 mg  20 mg Oral Q M,W,F Ivor Costa, MD        Allergies as of 12/22/2018  . (No Known Allergies)    Family History  Problem Relation Age of Onset  . Hypercholesterolemia Mother   . COPD Father     Social History   Socioeconomic History  . Marital status: Married    Spouse name: Not on file  . Number of children: Not on file  . Years of education: Not on file  . Highest education level: Not  on file  Occupational History  . Not on file  Social Needs  . Financial resource strain: Not on file  . Food insecurity:    Worry: Not on file    Inability: Not on file  . Transportation needs:    Medical: Not on file    Non-medical: Not on file  Tobacco Use  . Smoking status: Never Smoker  . Smokeless tobacco: Never Used  Substance and Sexual Activity  . Alcohol use: Yes    Comment: ocassionally  . Drug use: Never  . Sexual activity: Never  Lifestyle  . Physical activity:    Days per week: Not on file    Minutes per session: Not on file  . Stress: Not on file  Relationships  . Social connections:    Talks on phone: Not on file    Gets together: Not on file    Attends religious service: Not on file     Active member of club or organization: Not on file    Attends meetings of clubs or organizations: Not on file    Relationship status: Not on file  . Intimate partner violence:    Fear of current or ex partner: Not on file    Emotionally abused: Not on file    Physically abused: Not on file    Forced sexual activity: Not on file  Other Topics Concern  . Not on file  Social History Narrative  . Not on file    Review of Systems: Gen: Denies any fever, chills, sweats or fatigue. CV: Denies chest pain or palpitations.  Resp: Denies cough or SOB. GI: Denies heartburn or stomach pain. See HPI. GU : Denies dysuria or hematuria.. MS: Denies joint pain, or muscle aches. Derm: Denies rash or skin lesions. Psych: Denies anxiety or depression.  Heme: Denies bruising. Neuro:  Denies any headaches, dizziness, paresthesias. Endo:  Denies any problems with DM, thyroid, adrenal function.  Physical Exam: Vital signs in last 24 hours: Temp:  [98.9 F (37.2 C)-99.5 F (37.5 C)] 98.9 F (37.2 C) (05/17 0208) Pulse Rate:  [72-96] 77 (05/17 0208) Resp:  [18] 18 (05/17 0208) BP: (133-155)/(68-91) 155/83 (05/17 0208) SpO2:  [93 %-98 %] 98 % (05/17 0208) Weight:  [90.7 kg] 90.7 kg (05/16 2317)   General:   Alert,  Well-developed, well-nourished, pleasant and cooperative in NAD Head:  Normocephalic and atraumatic. Eyes:  Sclera clear, no icterus.   Conjunctiva pink. Ears:  Normal auditory acuity. Nose:  No deformity, discharge,  or lesions. Mouth:  No deformity or lesions.   Neck:  Supple. Lungs:  Clear throughout to auscultation.   Heart:  RRR, S1, S2, no murmur. Abdomen:  Soft, mild LUQ tenderness without rebound or guarding, + BS x 4 quads. Rectal:  Deferred. Commode with a large amount of darker red liquid  Msk:  Symmetrical without gross deformities.  Pulses:  Normal pulses noted. Extremities:  Without clubbing or edema. Neurologic:  Alert and  oriented x4;  grossly normal  neurologically. Skin:  Intact without significant lesions or rashes.. Psych:  Alert and cooperative. Normal mood and affect.  Intake/Output from previous day: No intake/output data recorded. Intake/Output this shift: No intake/output data recorded.  Lab Results: Recent Labs    12/22/18 2324 12/23/18 0141  WBC 10.2 10.9*  HGB 15.5 15.2  HCT 45.4 44.0  PLT 289 283   BMET Recent Labs    12/22/18 2324  NA 135  K 4.1  CL 104  CO2 21*  GLUCOSE 124*  BUN 19  CREATININE 1.14  CALCIUM 9.4   LFT Recent Labs    12/22/18 2324  PROT 6.7  ALBUMIN 3.8  AST 26  ALT 40  ALKPHOS 79  BILITOT 0.6   PT/INR Recent Labs    12/23/18 0141  LABPROT 13.7  INR 1.1    IMPRESSION/PLAN:   1. 53 y.o. male with painless hematochezia s/p colonoscopy with removal of 2 colon polyps by Dr. Collene Mares on 12/21/2018. Hx of diverticulitis. Admission Hg 15.5. Mild LUQ discomfort early this am while drinking bowel prep. No N/V. Hemodynamically stable.  -continue Nulytely bowel prep until 8:30am today then NPO  -diagnostic colonoscopy today, rule out post polypectomy bleed vs diverticular bleed -H/H, BMP now -IVF NS @ 100cc/hr -Reglan 10 mg IV now  Further recommendations per Dr. Corena Pilgrim Dorathy Daft  12/23/2018, 5:07 AM

## 2018-12-23 NOTE — Anesthesia Preprocedure Evaluation (Signed)
Anesthesia Evaluation  Patient identified by MRN, date of birth, ID band Patient awake    Reviewed: Allergy & Precautions, NPO status   Airway Mallampati: II  TM Distance: >3 FB     Dental   Pulmonary neg pulmonary ROS,    breath sounds clear to auscultation       Cardiovascular negative cardio ROS   Rhythm:Regular Rate:Normal     Neuro/Psych negative neurological ROS     GI/Hepatic Neg liver ROS, Hematochezia s/p colonoscopy   Endo/Other  negative endocrine ROS  Renal/GU negative Renal ROS     Musculoskeletal   Abdominal   Peds  Hematology negative hematology ROS (+)   Anesthesia Other Findings   Reproductive/Obstetrics                             Lab Results  Component Value Date   WBC 9.3 12/23/2018   HGB 13.2 12/23/2018   HCT 39.6 12/23/2018   MCV 92.1 12/23/2018   PLT 259 12/23/2018   Lab Results  Component Value Date   CREATININE 1.12 12/23/2018   BUN 16 12/23/2018   NA 135 12/23/2018   K 3.5 12/23/2018   CL 102 12/23/2018   CO2 26 12/23/2018    Anesthesia Physical Anesthesia Plan  ASA: II  Anesthesia Plan: MAC   Post-op Pain Management:    Induction: Intravenous  PONV Risk Score and Plan: 1 and Propofol infusion, Ondansetron and Treatment may vary due to age or medical condition  Airway Management Planned: Natural Airway and Simple Face Mask  Additional Equipment:   Intra-op Plan:   Post-operative Plan:   Informed Consent: I have reviewed the patients History and Physical, chart, labs and discussed the procedure including the risks, benefits and alternatives for the proposed anesthesia with the patient or authorized representative who has indicated his/her understanding and acceptance.       Plan Discussed with: CRNA  Anesthesia Plan Comments:         Anesthesia Quick Evaluation

## 2018-12-23 NOTE — H&P (View-Only) (Signed)
Referring Provider: Dr. Eric [place]  Primary Care Physician:  Patient, No Pcp Per Primary Gastroenterologist:  Dr. Verdia Kuba  Reason for Consultation:  Rectal bleeding, S/P colonoscopy 12/21/2018  HPI: Jeff Robles is a 53 y.o. male with a past medical history of kidney stones, hyperlipidemia, diverticulitis and colon polyps. He underwent a routine colonoscopy with  Dr. Collene Mares on 12/21/2018, he reported having 2 polyps removed ( procedure report not available for review). He went home and ate a sandwich and went to bed without experiencing any abdominal pain. Saturday he passed a few green loose stools then a formed brown stool with a moderate amount of gas. Later that evening, he went to the bathroom and passed a large amount of red blood per the rectum without passing any stool. Approximately 45 minutes later, he went to the bathroom and passed a large amount of blood per the rectum. No associated abdominal or rectal pain. He denied having any chest pain, SOB or dizziness. He presented to the ED for further evaluation. Prior history of diverticulitis, 3 to 4 episodes in his lifetime. Last treated for diverticulitis with po antibiotics. Never required hospitalization for diverticulitis episodes.   ED Course: Na 135. K 4.1. BUN 19. Cr. 1.14. Alk Phos 79. AST 26. ALT 40. T. Bili 0.6. WBC 10.2. Hg 15.2. HCT 45.4. PLT 289. INR 1.1. COVID19 negative. FOBT +. EKG NSR, no ischemia.   He passed a moderate amount of red blood from the rectum last night prior to starting Nulytely bowel Prep. Since starting the bowel prep, he is passing large amounts of darker red water, blood on tissue when wiping is brighter red. He continues to drink the bowel prep at this time.   Past Medical History:  Diagnosis Date  . HLD (hyperlipidemia)   . Kidney stones     Past Surgical History:  Procedure Laterality Date  . COLONOSCOPY    . Kidney stone procedure      Prior to Admission medications   Medication Sig Start  Date End Date Taking? Authorizing Provider  pravastatin (PRAVACHOL) 20 MG tablet Take 20 mg by mouth every Monday, Wednesday, and Friday. 11/26/18  Yes [provider]    Current Facility-Administered Medications  Medication Dose Route Frequency Provider Last Rate Last Dose  . 0.9 %  sodium chloride infusion   Intravenous Continuous Ivor Costa, MD 100 mL/hr at 12/23/18 0230    . acetaminophen (TYLENOL) tablet 650 mg  650 mg Oral Q6H PRN Ivor Costa, MD       Or  . acetaminophen (TYLENOL) suppository 650 mg  650 mg Rectal Q6H PRN Ivor Costa, MD      . ondansetron [facility]) tablet 4 mg  4 mg Oral Q6H PRN Ivor Costa, MD       Or  . ondansetron [facility]) injection 4 mg  4 mg Intravenous Q6H PRN Ivor Costa, MD      . Derrill Memo ON 12/24/2018] pravastatin (PRAVACHOL) tablet 20 mg  20 mg Oral Q M,W,F Ivor Costa, MD        Allergies as of 12/22/2018  . (No Known Allergies)    Family History  Problem Relation Age of Onset  . Hypercholesterolemia Mother   . COPD Father     Social History   Socioeconomic History  . Marital status: Married    Spouse name: Not on file  . Number of children: Not on file  . Years of education: Not on file  . Highest education level: Not  on file  Occupational History  . Not on file  Social Needs  . Financial resource strain: Not on file  . Food insecurity:    Worry: Not on file    Inability: Not on file  . Transportation needs:    Medical: Not on file    Non-medical: Not on file  Tobacco Use  . Smoking status: Never Smoker  . Smokeless tobacco: Never Used  Substance and Sexual Activity  . Alcohol use: Yes    Comment: ocassionally  . Drug use: Never  . Sexual activity: Never  Lifestyle  . Physical activity:    Days per week: Not on file    Minutes per session: Not on file  . Stress: Not on file  Relationships  . Social connections:    Talks on phone: Not on file    Gets together: Not on file    Attends religious service: Not on file     Active member of club or organization: Not on file    Attends meetings of clubs or organizations: Not on file    Relationship status: Not on file  . Intimate partner violence:    Fear of current or ex partner: Not on file    Emotionally abused: Not on file    Physically abused: Not on file    Forced sexual activity: Not on file  Other Topics Concern  . Not on file  Social History Narrative  . Not on file    Review of Systems: Gen: Denies any fever, chills, sweats or fatigue. CV: Denies chest pain or palpitations.  Resp: Denies cough or SOB. GI: Denies heartburn or stomach pain. See HPI. GU : Denies dysuria or hematuria.. MS: Denies joint pain, or muscle aches. Derm: Denies rash or skin lesions. Psych: Denies anxiety or depression.  Heme: Denies bruising. Neuro:  Denies any headaches, dizziness, paresthesias. Endo:  Denies any problems with DM, thyroid, adrenal function.  Physical Exam: Vital signs in last 24 hours: Temp:  [98.9 F (37.2 C)-99.5 F (37.5 C)] 98.9 F (37.2 C) (05/17 0208) Pulse Rate:  [72-96] 77 (05/17 0208) Resp:  [18] 18 (05/17 0208) BP: (133-155)/(68-91) 155/83 (05/17 0208) SpO2:  [93 %-98 %] 98 % (05/17 0208) Weight:  [90.7 kg] 90.7 kg (05/16 2317)   General:   Alert,  Well-developed, well-nourished, pleasant and cooperative in NAD Head:  Normocephalic and atraumatic. Eyes:  Sclera clear, no icterus.   Conjunctiva pink. Ears:  Normal auditory acuity. Nose:  No deformity, discharge,  or lesions. Mouth:  No deformity or lesions.   Neck:  Supple. Lungs:  Clear throughout to auscultation.   Heart:  RRR, S1, S2, no murmur. Abdomen:  Soft, mild LUQ tenderness without rebound or guarding, + BS x 4 quads. Rectal:  Deferred. Commode with a large amount of darker red liquid  Msk:  Symmetrical without gross deformities.  Pulses:  Normal pulses noted. Extremities:  Without clubbing or edema. Neurologic:  Alert and  oriented x4;  grossly normal  neurologically. Skin:  Intact without significant lesions or rashes.. Psych:  Alert and cooperative. Normal mood and affect.  Intake/Output from previous day: No intake/output data recorded. Intake/Output this shift: No intake/output data recorded.  Lab Results: Recent Labs    12/22/18 2324 12/23/18 0141  WBC 10.2 10.9*  HGB 15.5 15.2  HCT 45.4 44.0  PLT 289 283   BMET Recent Labs    12/22/18 2324  NA 135  K 4.1  CL 104  CO2 21*  GLUCOSE 124*  BUN 19  CREATININE 1.14  CALCIUM 9.4   LFT Recent Labs    12/22/18 2324  PROT 6.7  ALBUMIN 3.8  AST 26  ALT 40  ALKPHOS 79  BILITOT 0.6   PT/INR Recent Labs    12/23/18 0141  LABPROT 13.7  INR 1.1    IMPRESSION/PLAN:   1. 53 y.o. male with painless hematochezia s/p colonoscopy with removal of 2 colon polyps by Dr. Collene Mares on 12/21/2018. Hx of diverticulitis. Admission Hg 15.5. Mild LUQ discomfort early this am while drinking bowel prep. No N/V. Hemodynamically stable.  -continue Nulytely bowel prep until 8:30am today then NPO  -diagnostic colonoscopy today, rule out post polypectomy bleed vs diverticular bleed -H/H, BMP now -IVF NS @ 100cc/hr -Reglan 10 mg IV now  Further recommendations per Dr. Corena Pilgrim Dorathy Daft  12/23/2018, 5:07 AM

## 2018-12-23 NOTE — Transfer of Care (Signed)
Immediate Anesthesia Transfer of Care Note  Patient: Jeff Robles  Procedure(s) Performed: COLONOSCOPY WITH PROPOFOL (N/A )  Patient Location: Endoscopy Unit  Anesthesia Type:MAC  Level of Consciousness: awake, alert  and oriented  Airway & Oxygen Therapy: Patient Spontanous Breathing  Post-op Assessment: Report given to RN and Post -op Vital signs reviewed and stable  Post vital signs: Reviewed and stable  Last Vitals:  Vitals Value Taken Time  BP    Temp    Pulse 102 12/23/2018 11:13 AM  Resp 22 12/23/2018 11:13 AM  SpO2 95 % 12/23/2018 11:13 AM  Vitals shown include unvalidated device data.  Last Pain:  Vitals:   12/23/18 0932  TempSrc: Oral  PainSc: 0-No pain         Complications: No apparent anesthesia complications

## 2018-12-23 NOTE — Op Note (Signed)
[facility] Patient Name: Jeff Robles Procedure Date : 12/23/2018 MRN: [id number] Attending MD: Justice Britain , MD Date of Birth: Apr 02, 1966 CSN: [id number] Age: 53 Admit Type: Inpatient Procedure:                Colonoscopy Indications:              Treatment of bleeding from polypectomy site,                            Hematochezia, Diverticula Providers:                Justice Britain, MD, Burtis Junes, RN, William Dalton, Technician Referring MD:             Juanita Craver, MD Medicines:                Monitored Anesthesia Care Complications:            Severe pain Estimated Blood Loss:     Estimated blood loss: none. Procedure:                Pre-Anesthesia Assessment:                           - Prior to the procedure, a History and Physical                            was performed, and patient medications and                            allergies were reviewed. The patient's tolerance of                            previous anesthesia was also reviewed. The risks                            and benefits of the procedure and the sedation                            options and risks were discussed with the patient.                            All questions were answered, and informed consent                            was obtained. Prior Anticoagulants: The patient has                            taken no previous anticoagulant or antiplatelet                            agents. ASA Grade Assessment: II - A patient with                            mild systemic  disease. After reviewing the risks                            and benefits, the patient was deemed in                            satisfactory condition to undergo the procedure.                           After obtaining informed consent, the colonoscope                            was passed under direct vision. Throughout the                            procedure, the patient's blood  pressure, pulse, and                            oxygen saturations were monitored continuously. The                            CF-HQ190L ([id number]) Olympus colonoscope was                            introduced through the anus and advanced to the the                            cecum, identified by appendiceal orifice and                            ileocecal valve. The colonoscopy was technically                            difficult and complex due to the patient's                            inability to cooperate. The patient tolerated the                            procedure. The quality of the bowel preparation was                            poor. The ileocecal valve, appendiceal orifice, and                            rectum were photographed. Scope In: 10:14:41 AM Scope Out: 11:03:29 AM Scope Withdrawal Time: 0 hours 39 minutes 53 seconds  Total Procedure Duration: 0 hours 48 minutes 48 seconds  Findings:      The digital rectal exam findings include non-thrombosed external       hemorrhoids. Pertinent negatives include no palpable rectal lesions.      Red blood was found in the entire colon.      A single (solitary) twenty mm ulcer was found in the cecum. Spurting       bleeding  was present. Stigmata of recent bleeding were present. Area was       successfully injected with 7 mL of a 1:10,000 solution of epinephrine       for hemostasis. A visible vessel was found. Unfortunately, the patients       breathing pattern and snoring did not allow for adequate positioning or       timing to allow the BiCap to be used safely. As the epinephrine had       slowed the bleeding, I decided to do dual therapy instead of triple       therapy. I placed nine hemostatic clips were successfully placed (MR       conditional) to close the defect from previous mucosal resection as much       as possible. There was no bleeding at the end of the procedure.      A single (solitary) four mm ulcer was found  in the cecum from previous       polypectomy. For hemostasis, one hemostatic clip was successfully placed       to close the defect. There was no bleeding during, or at the end, of the       procedure.      Non-bleeding non-thrombosed external and internal hemorrhoids were found       during retroflexion, during perianal exam and during digital exam. The       hemorrhoids were Grade II (internal hemorrhoids that prolapse but reduce       spontaneously). Impression:               - Preparation of the colon was poor.                           - Non-thrombosed external hemorrhoids found on                            digital rectal exam.                           - Blood in the entire examined colon. Lavaged with                            poor visualization.                           - A single (solitary) ulcer in the cecum that was                            actively bleeding/oozing. Injected. Clips (MR                            conditional) were placed to close the                            post-mucosectomy defect.                           - A single (solitary) ulcer in the cecum. Clip was  placed.                           - Non-bleeding non-thrombosed external and internal                            hemorrhoids. Recommendation:           - The patient will be observed post-procedure,                            until all discharge criteria are met.                           - Return patient to hospital ward for ongoing care.                           - Advance diet as tolerated.                           - Patient awoke with significant discomfort in                            abdomen, generalized. Will obtain a STAT CXR & KUB                            to evaluate for signs of perforation. Felt unlikely                            based on the therapy and procedure itself, and more                            likely a result of the Epinephrine used in the                             region but need to be sure. Will consider a dose of                            IV Ciprofloxacin as well.                           - Dependent on patient's clinical status, would                            monitor Hgb/Hct today into this evening, if very                            stable and no evidence of further issues then may                            be able to be discharged late today vs monitor into                            tomorrow closely. Will discuss with patient and  team.                           - The findings and recommendations were discussed                            with the patient.                           - The findings and recommendations were discussed                            with the patient's family.                           - The findings and recommendations were discussed                            with the Hospital Team. Procedure Code(s):        --- Professional ---                           [phone], Colonoscopy, flexible; with control of                            bleeding, any method Diagnosis Code(s):        --- Professional ---                           K64.1, Second degree hemorrhoids                           K64.4, Residual hemorrhoidal skin tags                           K92.2, Gastrointestinal hemorrhage, unspecified                           K63.3, Ulcer of intestine                           K91.840, Postprocedural hemorrhage of a digestive                            system organ or structure following a digestive                            system procedure                           K92.1, Melena (includes Hematochezia)                           K57.30, Diverticulosis of large intestine without                            perforation or abscess without bleeding CPT copyright 2019 [employer]. All rights reserved. The codes documented  in this report are preliminary and upon  coder review may  be revised to meet current compliance requirements. Justice Britain, MD 12/23/2018 11:36:32 AM Number of Addenda: 0

## 2018-12-23 NOTE — ED Notes (Signed)
ED TO INPATIENT HANDOFF REPORT  ED Nurse Name and Phone #: [phone] Shawna Orleans, RN  S Name/Age/Gender Jeff Robles 53 y.o. male Room/Bed: 031C/031C  Code Status   Code Status: Full Code  Home/SNF/Other Home Patient oriented to: self, place, time and situation Is this baseline? Yes   Triage Complete: Triage complete  Chief Complaint Rectal Bleeding  Triage Note Per pt he had a colonoscopy yesterday where they removed two polyps, today he is having a rectal bleed all day long with BM.   Allergies No Known Allergies  Level of Care/Admitting Diagnosis ED Disposition    ED Disposition Condition Comment   Admit  Hospital Area: [facility] [[id number]]  Level of Care: Telemetry Medical [104]  I expect the patient will be discharged within 24 hours: No (not a candidate for 5C-Observation unit)  Covid Evaluation: Screening Protocol (No Symptoms)  Diagnosis: GIB (gastrointestinal bleeding) [785885]  Admitting Physician: Lorretta Harp [[id number]]  Attending Physician: Lorretta Harp [[id number]]  PT Class (Do Not Modify): Observation [104]  PT Acc Code (Do Not Modify): Observation [10022]       B Medical/Surgery History Past Medical History:  Diagnosis Date  . HLD (hyperlipidemia)   . Kidney stones       A IV Location/Drains/Wounds Patient Lines/Drains/Airways Status   Active Line/Drains/Airways    Name:   Placement date:   Placement time:   Site:   Days:   Peripheral IV 12/23/18 Left Hand   12/23/18    0129    Hand   less than 1          Intake/Output Last 24 hours No intake or output data in the 24 hours ending 12/23/18 0142  Labs/Imaging Results for orders placed or performed during the hospital encounter of 12/22/18 (from the past 48 hour(s))  Type and screen  [facility]     Status: None   Collection Time: 12/22/18 11:18 PM  Result Value Ref Range   ABO/RH(D) O POS    Antibody Screen NEG    Sample Expiration       12/25/2018,2359 Performed at [facility], [address], [place], [place] [address]   ABO/Rh     Status: None (Preliminary result)   Collection Time: 12/22/18 11:18 PM  Result Value Ref Range   ABO/RH(D)      O POS Performed at [facility], [address], [place], [place] [address]   Comprehensive metabolic panel     Status: Abnormal   Collection Time: 12/22/18 11:24 PM  Result Value Ref Range   Sodium 135 135 - 145 mmol/L   Potassium 4.1 3.5 - 5.1 mmol/L   Chloride 104 98 - 111 mmol/L   CO2 21 (L) 22 - 32 mmol/L   Glucose, Bld 124 (H) 70 - 99 mg/dL   BUN 19 6 - 20 mg/dL   Creatinine, Ser 6.76 0.61 - 1.24 mg/dL   Calcium 9.4 8.9 - 72.0 mg/dL   Total Protein 6.7 6.5 - 8.1 g/dL   Albumin 3.8 3.5 - 5.0 g/dL   AST 26 15 - 41 U/L   ALT 40 0 - 44 U/L   Alkaline Phosphatase 79 38 - 126 U/L   Total Bilirubin 0.6 0.3 - 1.2 mg/dL   GFR calc non Af Amer >60 >60 mL/min   GFR calc Af Amer >60 >60 mL/min   Anion gap 10 5 - 15    Comment: Performed at [facility], [address] [place], [place] [address]  CBC     Status: None   Collection Time: 12/22/18 11:24 PM  Result Value Ref Range   WBC 10.2 4.0 - 10.5 K/uL   RBC 4.94 4.22 - 5.81 MIL/uL   Hemoglobin 15.5 13.0 - 17.0 g/dL   HCT 19.145.4 47.839.0 - 29.552.0 %   MCV 91.9 80.0 - 100.0 fL   MCH 31.4 26.0 - 34.0 pg   MCHC 34.1 30.0 - 36.0 g/dL   RDW 62.111.9 30.811.5 - 65.715.5 %   Platelets 289 150 - 400 K/uL   nRBC 0.0 0.0 - 0.2 %    Comment: Performed at [facility], [address] [place], [place] [address]  POC occult blood, ED     Status: Abnormal   Collection Time: 12/23/18 12:55 AM  Result Value Ref Range   Fecal Occult Bld POSITIVE (A) NEGATIVE   No results found.  Pending Labs Unresulted Labs (From admission, onward)    Start     Ordered   12/23/18 0500  Basic metabolic panel  Tomorrow morning,   R     12/23/18 0128   12/23/18 0128  HIV antibody (Routine Testing)  Once,   R     12/23/18 0128   12/23/18  0127  Protime-INR  Once,   R     12/23/18 0126   12/23/18 0127  APTT  Once,   R     12/23/18 0126   12/23/18 0127  CBC  Now then every 6 hours,   R     12/23/18 0126   12/23/18 0100  SARS Coronavirus 2 (CEPHEID - Performed in [facility] hospital lab), Hosp Order  (Asymptomatic Patients Labs)  ONCE - STAT,   R    Question:  Rule Out  Answer:  Yes   12/23/18 0059          Vitals/Pain Today's Vitals   12/23/18 0031 12/23/18 0045 12/23/18 0100 12/23/18 0115  BP:  134/83 (!) 143/90 (!) 149/91  Pulse:  72 73 80  Resp:      Temp:      TempSrc:      SpO2:  96% 98% 97%  Weight:      Height:      PainSc: 0-No pain       Isolation Precautions No active isolations  Medications Medications  pravastatin (PRAVACHOL) tablet 20 mg (has no administration in time range)  sodium chloride 0.9 % bolus 1,000 mL (has no administration in time range)  0.9 %  sodium chloride infusion (has no administration in time range)  acetaminophen (TYLENOL) tablet 650 mg (has no administration in time range)    Or  acetaminophen (TYLENOL) suppository 650 mg (has no administration in time range)  ondansetron (ZOFRAN) tablet 4 mg (has no administration in time range)    Or  ondansetron (ZOFRAN) injection 4 mg (has no administration in time range)    Mobility walks Low fall risk   Focused Assessments GI   R Recommendations: See Admitting Provider Note  Report given to:   Additional Notes:

## 2018-12-23 NOTE — ED Notes (Signed)
Nurse collecting labs. 

## 2018-12-24 ENCOUNTER — Encounter (HOSPITAL_COMMUNITY): Payer: Self-pay | Admitting: Gastroenterology

## 2018-12-24 DIAGNOSIS — K922 Gastrointestinal hemorrhage, unspecified: Secondary | ICD-10-CM | POA: Diagnosis not present

## 2018-12-24 DIAGNOSIS — E785 Hyperlipidemia, unspecified: Secondary | ICD-10-CM | POA: Diagnosis not present

## 2018-12-24 LAB — CBC
HCT: 29.3 % — ABNORMAL LOW (ref 39.0–52.0)
HCT: 28.7 % — ABNORMAL LOW (ref 39.0–52.0)
Hemoglobin: 10.1 g/dL — ABNORMAL LOW (ref 13.0–17.0)
Hemoglobin: 9.5 g/dL — ABNORMAL LOW (ref 13.0–17.0)
MCH: 31.5 pg (ref 26.0–34.0)
MCH: 30.7 pg (ref 26.0–34.0)
MCHC: 34.5 g/dL (ref 30.0–36.0)
MCHC: 33.1 g/dL (ref 30.0–36.0)
MCV: 91.3 fL (ref 80.0–100.0)
MCV: 92.9 fL (ref 80.0–100.0)
Platelets: 205 10*3/uL (ref 150–400)
Platelets: 197 10*3/uL (ref 150–400)
RBC: 3.21 MIL/uL — ABNORMAL LOW (ref 4.22–5.81)
RBC: 3.09 MIL/uL — ABNORMAL LOW (ref 4.22–5.81)
RDW: 11.9 % (ref 11.5–15.5)
RDW: 12.2 % (ref 11.5–15.5)
WBC: 15.9 10*3/uL — ABNORMAL HIGH (ref 4.0–10.5)
WBC: 12 10*3/uL — ABNORMAL HIGH (ref 4.0–10.5)
nRBC: 0 % (ref 0.0–0.2)
nRBC: 0 % (ref 0.0–0.2)

## 2018-12-24 LAB — BASIC METABOLIC PANEL
Anion gap: 7 (ref 5–15)
BUN: 12 mg/dL (ref 6–20)
CO2: 24 mmol/L (ref 22–32)
Calcium: 7.9 mg/dL — ABNORMAL LOW (ref 8.9–10.3)
Chloride: 105 mmol/L (ref 98–111)
Creatinine, Ser: 1.13 mg/dL (ref 0.61–1.24)
GFR calc Af Amer: >60 mL/min (ref >60)
GFR calc non Af Amer: >60 mL/min (ref >60)
Glucose, Bld: 128 mg/dL — ABNORMAL HIGH (ref 70–99)
Potassium: 3.9 mmol/L (ref 3.5–5.1)
Sodium: 136 mmol/L (ref 135–145)

## 2018-12-24 LAB — GLUCOSE, CAPILLARY: Glucose-Capillary: 105 mg/dL — ABNORMAL HIGH (ref 70–99)

## 2018-12-24 NOTE — Discharge Summary (Signed)
Physician Discharge Summary  MAYFORD BECKSTRAND XHF:[id number] DOB: [birth year] DOA: 12/22/2018  PCP: Patient, No Pcp Per  Admit date: 12/22/2018 Discharge date: 12/24/2018  Admitted From: Home Disposition: Home  Recommendations for Outpatient Follow-up:  1. Follow up with PCP in 1 weeks 2. Please obtain CBC in one week to ensure hemoglobin remained stable  Home Health: No Equipment/Devices: None  Discharge Condition: Stable CODE STATUS: Full code Diet recommendation: Heart Healthy   History of present illness:  Lateef Talley Johnsonis a 53 y.o.malewith medical history significant ofHLD,kidney stone, who presents with rectal bleeding.  Patient states that he had routine colonoscopy, with twopolyps removedbyDr. Loreta Ave 5/15. He has been doing fine until tonight when he started havingrectal bleeding with bright red blood. He has had 2 episodesof bloody bowel movement. No nausea,vomiting, abdominal pain.He has some abdominal bloating. He had mild lightheadedness earlier, which has resolved. Denies chest pain, shortness of breat.No cough, fever or chills. No symptoms of UTI or unilateral weakness.  ED Course:pt was found to have hemoglobin 15.5, positive FOBT, WBC 10.2, electrolytes renal function okay, temperature 99.5, no tachycardia, no tachypnea, oxygen saturation 93-96% on room air, blood pressure 134/83. Patient is placed on telemetry bed for observation. [facility], Dr. Cheryl Flash consulted.  Hospital course:  Acute blood loss anemia Lower GI bleed Cecum ulcer x 2 Patient presenting from home with rectal bleeding.  Recent colonoscopy outpatient with removal of 2 polyps by Dr. Loreta Ave on 12/21/2018.  Hemoglobin on admission 15.5 with a positive FOBT.  Underwent repeat colonoscopy on 12/23/2018 with findings of a 20 mm ulcer localized to the cecum with spurting bleeding and visible vessel, underwent injection of 7 mL's of epinephrine and 9 clips placed, unable to perform BiCAP  due to patient's snoring.  There was also a 4 mm cecum ulcer from previous polypectomy which one clip was placed.  Patient did have some abdominal discomfort following the procedure in which a KUB was performed that showed no free air on the supine study; radiology recommended a right side up decubitus abdominal x-ray for further evaluation.  Right decubitus abdominal x-ray without any free air noted.  Patient's abdominal pain had resolved.  Patient was monitored overnight with serial hemoglobins which remained stable.  Patient with bowel movements this morning with old blood and no further bleeding.  Was seen by his primary gastroenterologist, Dr. Loreta Ave this morning with recommendations of discharge home and follow-up as needed.   Discharge Diagnoses:  Active Problems:   HLD (hyperlipidemia)    Discharge Instructions  Discharge Instructions    Call MD for:   Complete by:  As directed    Call GI provider for recurrent bleeding/blood in stool   Call MD for:  difficulty breathing, headache or visual disturbances   Complete by:  As directed    Call MD for:  persistant dizziness or light-headedness   Complete by:  As directed    Call MD for:  persistant nausea and vomiting   Complete by:  As directed    Call MD for:  severe uncontrolled pain   Complete by:  As directed    Call MD for:  temperature >100.4   Complete by:  As directed    Diet - low sodium heart healthy   Complete by:  As directed    Increase activity slowly   Complete by:  As directed      Allergies as of 12/24/2018   No Known Allergies     Medication List    TAKE these  medications   pravastatin 20 MG tablet Commonly known as:  PRAVACHOL Take 20 mg by mouth every Monday, Wednesday, and Friday.       No Known Allergies  Consultations:  [facility]   Procedures/Studies: Dg Abd 1 View - Kub  Result Date: 12/23/2018 CLINICAL DATA:  Evaluate for free air after colonoscopy EXAM: ABDOMEN - 1 VIEW  COMPARISON:  None. FINDINGS: Cholecystectomy clips are noted. Bowel gas pattern is unremarkable. No free air on this supine study. IMPRESSION: No free air on this supine study. If there is continued concern, an upright PA and lateral chest x-ray or a right side up decubitus abdominal x-ray could further evaluate. Electronically Signed   By: Gerome Sam III M.D   On: 12/23/2018 11:59   Dg Chest Port 1 View  Result Date: 12/23/2018 CLINICAL DATA:  Evaluate for free air after colonoscopy. EXAM: PORTABLE CHEST 1 VIEW COMPARISON:  None. FINDINGS: This study is only semi upright. However, within this limitation, there is no free air under the diaphragm identified. The heart, hila, mediastinum are normal. No pneumothorax. No pulmonary nodules or masses. Minimal atelectasis in the left base. The cardiomediastinal silhouette is normal. IMPRESSION: 1. No free air is identified on this semi upright view. If there is continued concern, a fully upright view or decubitus view could be obtained. No other abnormalities. Electronically Signed   By: Gerome Sam III M.D   On: 12/23/2018 11:58   Dg Abd Decub  Result Date: 12/23/2018 CLINICAL DATA:  Post colonoscopy. Patient underwent clipping of active bleeding in the cecum. EXAM: ABDOMEN - 1 VIEW DECUBITUS 1:51 p.m.: COMPARISON:  AP abdominal image earlier same day at 11:20 a.m. FINDINGS: No evidence of free intraperitoneal air on the LATERAL decubitus image. Normal bowel gas pattern. Air-fluid levels in the colon related to retain fluid from the colonoscopy. The vascular clips are noted in the RIGHT mid abdomen at the site of vessel hemostasis. IMPRESSION: No evidence of free intraperitoneal air. Electronically Signed   By: Hulan Saas M.D.   On: 12/23/2018 15:26    Colonoscopy 12/23/2018   Subjective: Patient seen and examined at bedside.  Resting comfortably.  Abdominal pain has resolved.  Reported bowel movement early this morning brown with some old  blood, no bright red blood.  No other complaints at this time.  Was seen by his primary gastrologist with recommendations of discharge home.  Denies headache, no fever/chills/night sweats, no chest pain, no palpitations, no shortness of breath, no abdominal pain.  No acute events overnight per nursing staff.   Discharge Exam: Vitals:   12/23/18 2053 12/24/18 0547  BP: 124/79 115/73  Pulse: 99 86  Resp: 18 18  Temp: 99.1 F (37.3 C) 98.7 F (37.1 C)  SpO2: 95% 95%   Vitals:   12/23/18 1426 12/23/18 1635 12/23/18 2053 12/24/18 0547  BP: 117/73 133/70 124/79 115/73  Pulse: 89 90 99 86  Resp: Temp: 98 F (36.7 C) 99.3 F (37.4 C) 99.1 F (37.3 C) 98.7 F (37.1 C)  TempSrc: Oral Oral Oral Oral  SpO2: 93% 95% 95% 95%  Weight:      Height:        General: Pt is alert, awake, not in acute distress Cardiovascular: RRR, S1/S2 +, no rubs, no gallops Respiratory: CTA bilaterally, no wheezing, no rhonchi Abdominal: Soft, NT, ND, bowel sounds + Extremities: no edema, no cyanosis    The results of significant diagnostics from this hospitalization (including imaging,  microbiology, ancillary and laboratory) are listed below for reference.     Microbiology: Recent Results (from the past 240 hour(s))  SARS Coronavirus 2 (CEPHEID - Performed in [facility] hospital lab), Hosp Order     Status: None   Collection Time: 12/23/18  1:16 AM  Result Value Ref Range Status   SARS Coronavirus 2 NEGATIVE NEGATIVE Final    Comment: (NOTE) If result is NEGATIVE SARS-CoV-2 target nucleic acids are NOT DETECTED. The SARS-CoV-2 RNA is generally detectable in upper and lower  respiratory specimens during the acute phase of infection. The lowest  concentration of SARS-CoV-2 viral copies this assay can detect is 250  copies / mL. A negative result does not preclude SARS-CoV-2 infection  and should not be used as the sole basis for treatment or other  patient management decisions.  A  negative result may occur with  improper specimen collection / handling, submission of specimen other  than nasopharyngeal swab, presence of viral mutation(s) within the  areas targeted by this assay, and inadequate number of viral copies  (<250 copies / mL). A negative result must be combined with clinical  observations, patient history, and epidemiological information. If result is POSITIVE SARS-CoV-2 target nucleic acids are DETECTED. The SARS-CoV-2 RNA is generally detectable in upper and lower  respiratory specimens dur ing the acute phase of infection.  Positive  results are indicative of active infection with SARS-CoV-2.  Clinical  correlation with patient history and other diagnostic information is  necessary to determine patient infection status.  Positive results do  not rule out bacterial infection or co-infection with other viruses. If result is PRESUMPTIVE POSTIVE SARS-CoV-2 nucleic acids MAY BE PRESENT.   A presumptive positive result was obtained on the submitted specimen  and confirmed on repeat testing.  While 2019 novel coronavirus  (SARS-CoV-2) nucleic acids may be present in the submitted sample  additional confirmatory testing may be necessary for epidemiological  and / or clinical management purposes  to differentiate between  SARS-CoV-2 and other Sarbecovirus currently known to infect humans.  If clinically indicated additional testing with an alternate test  methodology [phone]) is advised. The SARS-CoV-2 RNA is generally  detectable in upper and lower respiratory sp ecimens during the acute  phase of infection. The expected result is Negative. Fact Sheet for Patients:  BoilerBrush.com.cy Fact Sheet for Healthcare Providers: https://pope.com/ This test is not yet approved or cleared by the [place] FDA and has been authorized for detection and/or diagnosis of SARS-CoV-2 by FDA under an Emergency Use  Authorization (EUA).  This EUA will remain in effect (meaning this test can be used) for the duration of the COVID-19 declaration under Section 564(b)(1) of the Act, 21 U.S.C. section 360bbb-3(b)(1), unless the authorization is terminated or revoked sooner. Performed at [facility], [address], [place], [place] [address]      Labs: BNP (last 3 results) No results for input(s): BNP in the last 8760 hours. Basic Metabolic Panel: Recent Labs  Lab 12/22/18 2324 12/23/18 0744 12/24/18 0101  NA 135 135 136  K 4.1 3.5 3.9  CL 104 102 105  CO2 21* 26 24  GLUCOSE 124* 119* 128*  BUN CREATININE 1.14 1.12 1.13  CALCIUM 9.4 8.3* 7.9*   Liver Function Tests: Recent Labs  Lab 12/22/18 2324  AST 26  ALT 40  ALKPHOS 79  BILITOT 0.6  PROT 6.7  ALBUMIN 3.8   No results for input(s): LIPASE, AMYLASE in the  last 168 hours. No results for input(s): AMMONIA in the last 168 hours. CBC: Recent Labs  Lab 12/23/18 0744 12/23/18 1413 12/23/18 1951 12/24/18 0101 12/24/18 0721  WBC 9.3 18.1* 19.8* 15.9* 12.0*  HGB 13.2 12.0* 10.5* 10.1* 9.5*  HCT 39.6 35.4* 30.7* 29.3* 28.7*  MCV 92.1 93.4 91.6 91.3 92.9  PLT 259 245 229 205 197   Cardiac Enzymes: No results for input(s): CKTOTAL, CKMB, CKMBINDEX, TROPONINI in the last 168 hours. BNP: Invalid input(s): POCBNP CBG: Recent Labs  Lab 12/23/18 0620 12/24/18 0546  GLUCAP 93 105*   D-Dimer No results for input(s): DDIMER in the last 72 hours. Hgb A1c No results for input(s): HGBA1C in the last 72 hours. Lipid Profile No results for input(s): CHOL, HDL, LDLCALC, TRIG, CHOLHDL, LDLDIRECT in the last 72 hours. Thyroid function studies No results for input(s): TSH, T4TOTAL, T3FREE, THYROIDAB in the last 72 hours.  Invalid input(s): FREET3 Anemia work up No results for input(s): VITAMINB12, FOLATE, FERRITIN, TIBC, IRON, RETICCTPCT in the last 72 hours. Urinalysis No results found for: COLORURINE,  APPEARANCEUR, LABSPEC, PHURINE, GLUCOSEU, HGBUR, BILIRUBINUR, KETONESUR, PROTEINUR, UROBILINOGEN, NITRITE, LEUKOCYTESUR Sepsis Labs Invalid input(s): PROCALCITONIN,  WBC,  LACTICIDVEN Microbiology Recent Results (from the past 240 hour(s))  SARS Coronavirus 2 (CEPHEID - Performed in [facility] hospital lab), Hosp Order     Status: None   Collection Time: 12/23/18  1:16 AM  Result Value Ref Range Status   SARS Coronavirus 2 NEGATIVE NEGATIVE Final    Comment: (NOTE) If result is NEGATIVE SARS-CoV-2 target nucleic acids are NOT DETECTED. The SARS-CoV-2 RNA is generally detectable in upper and lower  respiratory specimens during the acute phase of infection. The lowest  concentration of SARS-CoV-2 viral copies this assay can detect is 250  copies / mL. A negative result does not preclude SARS-CoV-2 infection  and should not be used as the sole basis for treatment or other  patient management decisions.  A negative result may occur with  improper specimen collection / handling, submission of specimen other  than nasopharyngeal swab, presence of viral mutation(s) within the  areas targeted by this assay, and inadequate number of viral copies  (<250 copies / mL). A negative result must be combined with clinical  observations, patient history, and epidemiological information. If result is POSITIVE SARS-CoV-2 target nucleic acids are DETECTED. The SARS-CoV-2 RNA is generally detectable in upper and lower  respiratory specimens dur ing the acute phase of infection.  Positive  results are indicative of active infection with SARS-CoV-2.  Clinical  correlation with patient history and other diagnostic information is  necessary to determine patient infection status.  Positive results do  not rule out bacterial infection or co-infection with other viruses. If result is PRESUMPTIVE POSTIVE SARS-CoV-2 nucleic acids MAY BE PRESENT.   A presumptive positive result was obtained on the submitted  specimen  and confirmed on repeat testing.  While 2019 novel coronavirus  (SARS-CoV-2) nucleic acids may be present in the submitted sample  additional confirmatory testing may be necessary for epidemiological  and / or clinical management purposes  to differentiate between  SARS-CoV-2 and other Sarbecovirus currently known to infect humans.  If clinically indicated additional testing with an alternate test  methodology [phone]) is advised. The SARS-CoV-2 RNA is generally  detectable in upper and lower respiratory sp ecimens during the acute  phase of infection. The expected result is Negative. Fact Sheet for Patients:  BoilerBrush.com.cy Fact Sheet for Healthcare Providers: https://pope.com/ This test is not  yet approved or cleared by the [place] FDA and has been authorized for detection and/or diagnosis of SARS-CoV-2 by FDA under an Emergency Use Authorization (EUA).  This EUA will remain in effect (meaning this test can be used) for the duration of the COVID-19 declaration under Section 564(b)(1) of the Act, 21 U.S.C. section 360bbb-3(b)(1), unless the authorization is terminated or revoked sooner. Performed at [facility], [address] [place], [place] [address]      Time coordinating discharge: Over 30 minutes  SIGNED:   Alvira PhilipsEric J [place], DO  [facility] 12/24/2018, 12:42 PM

## 2018-12-24 NOTE — Progress Notes (Signed)
Jeff Robles to be discharged Home per MD order. Discussed prescriptions and follow up appointments with the patient. Prescriptions given to patient; medication list explained in detail. Patient verbalized understanding.  Skin clean, dry and intact without evidence of skin break down, no evidence of skin tears noted. IV catheter discontinued intact. Site without signs and symptoms of complications. Dressing and pressure applied. Pt denies pain at the site currently. No complaints noted.  Patient free of lines, drains, and wounds.   An After Visit Summary (AVS) was printed and given to the patient. Patient escorted via wheelchair, and discharged home via private auto.  Shanna Cisco, RN

## 2018-12-24 NOTE — Discharge Instructions (Signed)
Lower Gastrointestinal Bleeding  Lower gastrointestinal (GI) bleeding is the result of bleeding from the colon, rectum, or anal area. The colon is the last part of the digestive tract, where stool, also called feces, is formed. If you have lower GI bleeding, you may see blood in or on your stool. It may be bright red. Lower GI bleeding often stops without treatment. Continued or heavy bleeding needs emergency treatment at the hospital. What are the causes? Lower GI bleeding may be caused by:  A condition that causes pouches to form in the colon over time (diverticulosis).  Swelling and irritation (inflammation) in areas with diverticulosis (diverticulitis).  Inflammation of the colon (inflammatory bowel disease).  Swollen veins in the rectum (hemorrhoids).  Painful tears in the anus (anal fissures), often caused by passing hard stools.  Cancer of the colon or rectum.  Noncancerous growths (polyps) of the colon or rectum.  A bleeding disorder that impairs the formation of blood clots and causes easy bleeding (coagulopathy).  An abnormal weakening of a blood vessel where an artery and a vein come together (arteriovenous malformation). What increases the risk? You are more likely to develop this condition if:  You are older than 55 years of age.  You take aspirin or NSAIDs on a regular basis.  You take anticoagulant or antiplatelet drugs.  You have a history of high-dose X-ray treatment (radiation therapy) of the colon.  You recently had a colon polyp removed. What are the signs or symptoms? Symptoms of this condition include:  Bright red blood or blood clots coming from your rectum.  Bloody stools.  Black or maroon-colored stools.  Pain or cramping in the abdomen.  Weakness or dizziness.  Racing heartbeat. How is this diagnosed? This condition may be diagnosed based on:  Your symptoms and medical history.  A physical exam. During the exam, your health care  provider will check for signs of blood loss, such as low blood pressure and a rapid pulse.  Tests, such as: ? Flexible sigmoidoscopy. In this procedure, a flexible tube with a camera on the end is used to examine your anus and the first part of your colon to look for the source of bleeding. ? Colonoscopy. This is similar to a flexible sigmoidoscopy, but the camera can extend all the way to the uppermost part of your colon. ? Blood tests to measure your red blood cell count and to check for coagulopathy. ? An imaging study of your colon to look for a bleeding site. In some cases, you may have X-rays taken after a dye or radioactive substance is injected into your bloodstream (angiogram). How is this treated? Treatment for this condition depends on the cause of the bleeding. Heavy or persistent bleeding is treated at the hospital. Treatment may include:  Getting fluids through an IV tube inserted into one of your veins.  Getting blood through an IV tube (blood transfusion).  Stopping bleeding through high-heat coagulation, injections of certain medicines, or applying surgical clips. This can all be done during a colonoscopy.  Having a procedure that involves first doing an angiogram and then blocking blood flow to the bleeding site (embolization).  Stopping some of your regular medicines for a certain amount of time.  Having surgery to remove part of the colon. This may be needed if bleeding is severe and does not respond to other treatment. Follow these instructions at home:  Take over-the-counter and prescription medicines only as told by your health care provider. You may need to   avoid aspirin, NSAIDs, or other medicines that increase bleeding.  Eat foods that are high in fiber. This will help keep your stools soft. These foods include whole grains, legumes, fruits, and vegetables. Eating 1-3 prunes each day works well for many people.  Drink enough fluid to keep your urine clear or pale  yellow.  Keep all follow-up visits as told by your health care provider. This is important. Contact a health care provider if:  Your symptoms do not improve. Get help right away if:  Your bleeding increases.  You feel light-headed or you faint.  You feel weak.  You have severe cramps in your back or abdomen.  You pass large blood clots in your stool.  Your symptoms get worse. This information is not intended to replace advice given to you by your health care provider. Make sure you discuss any questions you have with your health care provider. Document Released: 12/10/2015 Document Revised: 12/31/2015 Document Reviewed: 12/10/2015 Elsevier Interactive Patient Education  2019 [employer]  

## 2018-12-24 NOTE — Progress Notes (Addendum)
Subjective: Since I last evaluated the patient, he was hospitalized for a post-polypectomy bleed after he had a colonoscopy on Friday 12/21/2018. Appreciate Dr. Kallie EdwardMansuraty doing his procedure where 9 clips were placed on a bleeding cecal ulcer. Post-procedure pain,reported but films wer all WNL. Patient denies having any GI symptoms at this time. He is toelrating his diet well. He had a BM this morning with old heme in it. Hemoglobin is down to 9.5 gms/dl.  Objective: Vital signs in last 24 hours: Temp:  [97.8 F (36.6 C)-99.3 F (37.4 C)] 98.7 F (37.1 C) (05/18 0547) Pulse Rate:  [71-106] 86 (05/18 0547) Resp:  [11-27] 18 (05/18 0547) BP: (115-175)/(68-127) 115/73 (05/18 0547) SpO2:  [88 %-100 %] 95 % (05/18 0547) Weight:  [90 kg] 90 kg (05/17 0932) Last BM Date: 12/23/18  Intake/Output from previous day: 05/17 0701 - 05/18 0700 In: 1363.3 [P.O.:200; I.V.:690.1; IV Piggyback:473.2] Out: 300 [Stool:300] Intake/Output this shift: No intake/output data recorded.  General appearance: alert, cooperative, appears stated age and no distress Resp: clear to auscultation bilaterally Cardio: regular rate and rhythm, S1, S2 normal, no murmur, click, rub or gallop GI: soft, non-tender; bowel sounds normal; no masses,  no organomegaly Extremities: extremities normal, atraumatic, no cyanosis or edema  Lab Results: Recent Labs    12/23/18 1951 12/24/18 0101 12/24/18 0721  WBC 19.8* 15.9* 12.0*  HGB 10.5* 10.1* 9.5*  HCT 30.7* 29.3* 28.7*  PLT 229 205 197   BMET Recent Labs    12/22/18 2324 12/23/18 0744 12/24/18 0101  NA 135 135 136  K 4.1 3.5 3.9  CL 104 102 105  CO2 21* 26 24  GLUCOSE 124* 119* 128*  BUN 19 16 12   CREATININE 1.14 1.12 1.13  CALCIUM 9.4 8.3* 7.9*   LFT Recent Labs    12/22/18 2324  PROT 6.7  ALBUMIN 3.8  AST 26  ALT 40  ALKPHOS 79  BILITOT 0.6   PT/INR Recent Labs    12/23/18 0141  LABPROT 13.7  INR 1.1   Studies/Results: Dg Abd 1 View -  Kub  Result Date: 12/23/2018 CLINICAL DATA:  Evaluate for free air after colonoscopy EXAM: ABDOMEN - 1 VIEW COMPARISON:  None. FINDINGS: Cholecystectomy clips are noted. Bowel gas pattern is unremarkable. No free air on this supine study. IMPRESSION: No free air on this supine study. If there is continued concern, an upright PA and lateral chest x-ray or a right side up decubitus abdominal x-ray could further evaluate. Electronically Signed   By: Gerome Samavid  Williams III M.D   On: 12/23/2018 11:59   Dg Chest Port 1 View  Result Date: 12/23/2018 CLINICAL DATA:  Evaluate for free air after colonoscopy. EXAM: PORTABLE CHEST 1 VIEW COMPARISON:  None. FINDINGS: This study is only semi upright. However, within this limitation, there is no free air under the diaphragm identified. The heart, hila, mediastinum are normal. No pneumothorax. No pulmonary nodules or masses. Minimal atelectasis in the left base. The cardiomediastinal silhouette is normal. IMPRESSION: 1. No free air is identified on this semi upright view. If there is continued concern, a fully upright view or decubitus view could be obtained. No other abnormalities. Electronically Signed   By: Gerome Samavid  Williams III M.D   On: 12/23/2018 11:58   Dg Abd Decub  Result Date: 12/23/2018 CLINICAL DATA:  Post colonoscopy. Patient underwent clipping of active bleeding in the cecum. EXAM: ABDOMEN - 1 VIEW DECUBITUS 1:51 p.m.: COMPARISON:  AP abdominal image earlier same day at 11:20 a.m. FINDINGS:  No evidence of free intraperitoneal air on the LATERAL decubitus image. Normal bowel gas pattern. Air-fluid levels in the colon related to retain fluid from the colonoscopy. The vascular clips are noted in the RIGHT mid abdomen at the site of vessel hemostasis. IMPRESSION: No evidence of free intraperitoneal air. Electronically Signed   By: Hulan Saas M.D.   On: 12/23/2018 15:26   Medications: I have reviewed the patient's current  medications.  Assessment/Plan: Post-polypectomy bleed/post-hemorrhagic anemia currently stable to be discharged. He has been advised to follow up with me as needed in the future.  LOS: 0 days   Britney Newstrom 12/24/2018, 9:22 AM

## 2019-02-26 ENCOUNTER — Encounter (HOSPITAL_COMMUNITY): Payer: Self-pay | Admitting: Gastroenterology

## 2020-03-21 IMAGING — DX PORTABLE CHEST - 1 VIEW
1 series · 1 of 1 positions shown · non-contrast
Comparison: None.

CLINICAL DATA: Evaluate for free air after colonoscopy.

EXAM:
PORTABLE CHEST 1 VIEW

[chest]
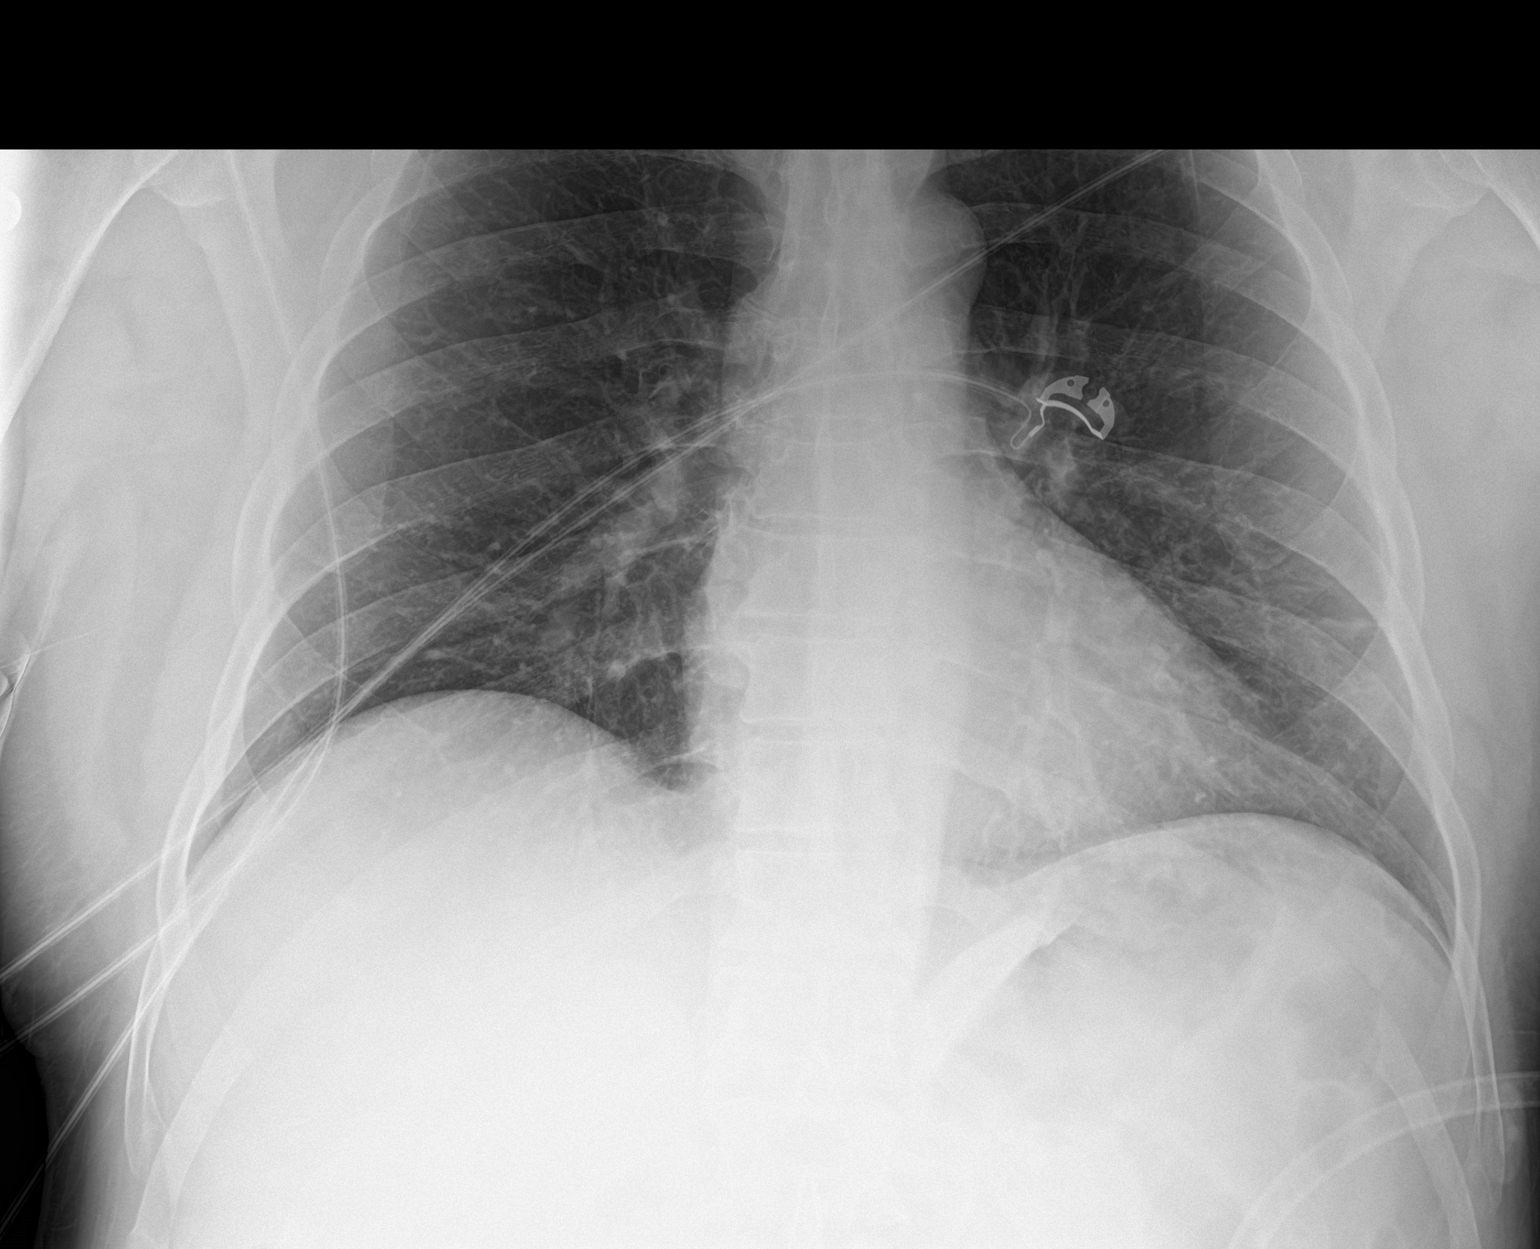

[1 of 1 positions shown; findings below may reference images not displayed]

FINDINGS: This study is only semi upright. However, within this limitation,
there is no free air under the diaphragm identified. The heart,
hila, mediastinum are normal. No pneumothorax. No pulmonary nodules
or masses. Minimal atelectasis in the left base. The
cardiomediastinal silhouette is normal.
IMPRESSION: 1. No free air is identified on this semi upright view. If there is
continued concern, a fully upright view or decubitus view could be
obtained. No other abnormalities.

## 2020-03-21 IMAGING — DX ABDOMEN - 1 VIEW
1 series · 1 of 1 positions shown · non-contrast
Comparison: None.

CLINICAL DATA: Evaluate for free air after colonoscopy

EXAM:
ABDOMEN - 1 VIEW

[abdomen]
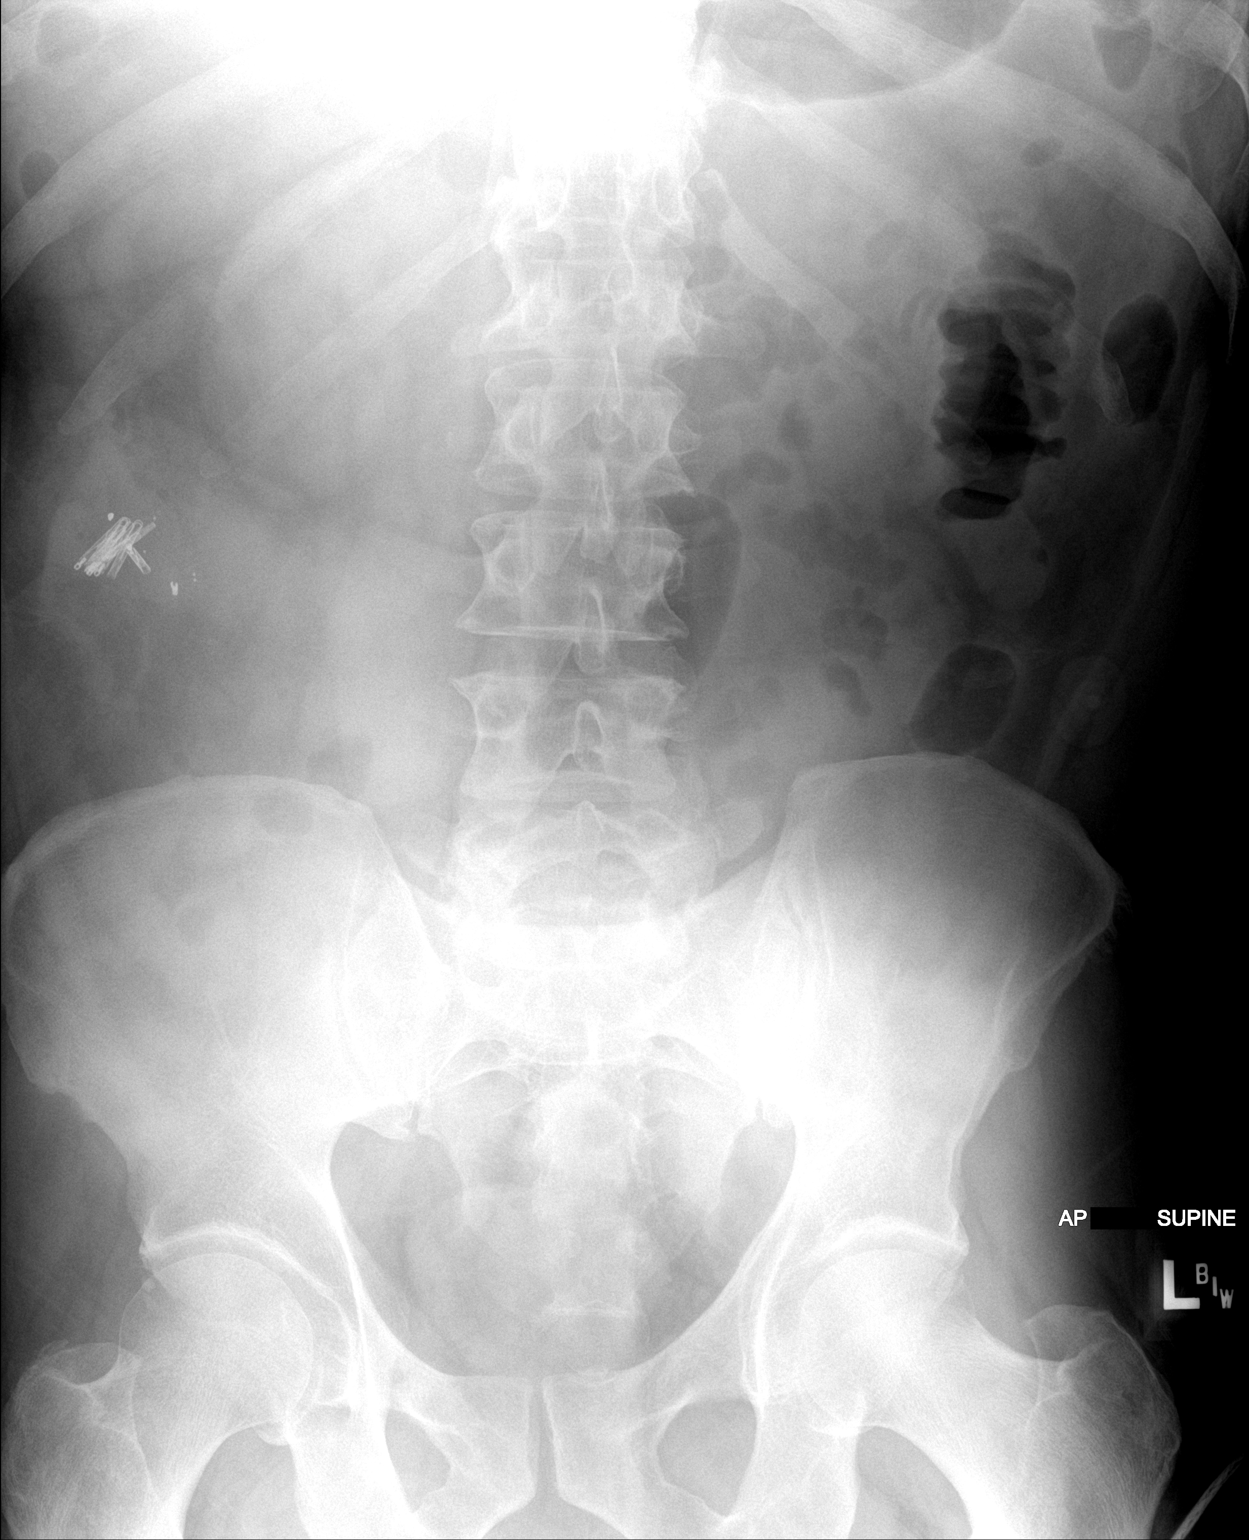

[1 of 1 positions shown; findings below may reference images not displayed]

FINDINGS: Cholecystectomy clips are noted. Bowel gas pattern is unremarkable.
No free air on this supine study.
IMPRESSION: No free air on this supine study. If there is continued concern, an
upright PA and lateral chest x-ray or a right side up decubitus
abdominal x-ray could further evaluate.
